# Patient Record
Sex: Female | Born: 1955 | Race: White | Hispanic: No | Marital: Married | Smoking: Never smoker
Health system: Southern US, Community
[De-identification: ages and names within clinical notes are randomized; demographics above are authoritative.]

## PROBLEM LIST (undated history)

## (undated) DIAGNOSIS — G43909 Migraine, unspecified, not intractable, without status migrainosus: Secondary | ICD-10-CM

## (undated) DIAGNOSIS — K219 Gastro-esophageal reflux disease without esophagitis: Secondary | ICD-10-CM

## (undated) DIAGNOSIS — I4891 Unspecified atrial fibrillation: Secondary | ICD-10-CM

## (undated) DIAGNOSIS — E785 Hyperlipidemia, unspecified: Secondary | ICD-10-CM

## (undated) DIAGNOSIS — K589 Irritable bowel syndrome without diarrhea: Secondary | ICD-10-CM

## (undated) DIAGNOSIS — C439 Malignant melanoma of skin, unspecified: Secondary | ICD-10-CM

## (undated) DIAGNOSIS — R002 Palpitations: Secondary | ICD-10-CM

## (undated) DIAGNOSIS — I491 Atrial premature depolarization: Secondary | ICD-10-CM

## (undated) DIAGNOSIS — N8501 Benign endometrial hyperplasia: Principal | ICD-10-CM

## (undated) DIAGNOSIS — R87619 Unspecified abnormal cytological findings in specimens from cervix uteri: Secondary | ICD-10-CM

## (undated) DIAGNOSIS — I10 Essential (primary) hypertension: Secondary | ICD-10-CM

## (undated) DIAGNOSIS — C4490 Unspecified malignant neoplasm of skin, unspecified: Secondary | ICD-10-CM

## (undated) DIAGNOSIS — R159 Full incontinence of feces: Secondary | ICD-10-CM

## (undated) HISTORY — DX: Irritable bowel syndrome, unspecified: K58.9

## (undated) HISTORY — DX: Full incontinence of feces: R15.9

## (undated) HISTORY — DX: Unspecified atrial fibrillation: I48.91

## (undated) HISTORY — DX: Hyperlipidemia, unspecified: E78.5

## (undated) HISTORY — DX: Atrial premature depolarization: I49.1

## (undated) HISTORY — DX: Migraine, unspecified, not intractable, without status migrainosus: G43.909

## (undated) HISTORY — DX: Essential (primary) hypertension: I10

## (undated) HISTORY — DX: Unspecified abnormal cytological findings in specimens from cervix uteri: R87.619

## (undated) HISTORY — DX: Malignant melanoma of skin, unspecified: C43.9

## (undated) HISTORY — DX: Palpitations: R00.2

## (undated) HISTORY — PX: WRIST SURGERY: SHX841

## (undated) HISTORY — DX: Unspecified malignant neoplasm of skin, unspecified: C44.90

## (undated) HISTORY — DX: Benign endometrial hyperplasia: N85.01

## (undated) HISTORY — PX: CERVICAL CERCLAGE: SHX1329

## (undated) HISTORY — DX: Gastro-esophageal reflux disease without esophagitis: K21.9

## (undated) HISTORY — PX: OTHER SURGICAL HISTORY: SHX169

## (undated) HISTORY — PX: SHOULDER ARTHROSCOPY: SHX128

---

## 1978-03-23 HISTORY — PX: CHOLECYSTECTOMY: SHX55

## 1999-12-03 ENCOUNTER — Encounter: Payer: Self-pay | Admitting: Obstetrics and Gynecology

## 1999-12-03 ENCOUNTER — Encounter: Admission: RE | Admit: 1999-12-03 | Discharge: 1999-12-03 | Payer: Self-pay | Admitting: Obstetrics and Gynecology

## 2001-02-08 ENCOUNTER — Encounter: Admission: RE | Admit: 2001-02-08 | Discharge: 2001-02-08 | Payer: Self-pay | Admitting: Obstetrics and Gynecology

## 2001-02-08 ENCOUNTER — Encounter: Payer: Self-pay | Admitting: Obstetrics and Gynecology

## 2002-01-25 ENCOUNTER — Encounter: Admission: RE | Admit: 2002-01-25 | Discharge: 2002-01-25 | Payer: Self-pay | Admitting: Obstetrics and Gynecology

## 2002-01-25 ENCOUNTER — Encounter: Payer: Self-pay | Admitting: Obstetrics and Gynecology

## 2003-03-13 ENCOUNTER — Encounter: Admission: RE | Admit: 2003-03-13 | Discharge: 2003-03-13 | Payer: Self-pay | Admitting: Obstetrics and Gynecology

## 2004-03-23 DIAGNOSIS — C439 Malignant melanoma of skin, unspecified: Secondary | ICD-10-CM

## 2004-03-23 HISTORY — DX: Malignant melanoma of skin, unspecified: C43.9

## 2004-03-23 HISTORY — PX: MELANOMA EXCISION: SHX5266

## 2004-09-08 ENCOUNTER — Encounter: Admission: RE | Admit: 2004-09-08 | Discharge: 2004-09-08 | Payer: Self-pay | Admitting: Obstetrics and Gynecology

## 2004-09-30 DIAGNOSIS — C4490 Unspecified malignant neoplasm of skin, unspecified: Secondary | ICD-10-CM

## 2004-09-30 HISTORY — DX: Unspecified malignant neoplasm of skin, unspecified: C44.90

## 2004-10-15 ENCOUNTER — Encounter: Admission: RE | Admit: 2004-10-15 | Discharge: 2004-10-27 | Payer: Self-pay | Admitting: Plastic Surgery

## 2004-10-21 ENCOUNTER — Ambulatory Visit (HOSPITAL_COMMUNITY): Admission: RE | Admit: 2004-10-21 | Discharge: 2004-10-21 | Payer: Self-pay | Admitting: General Surgery

## 2004-11-10 ENCOUNTER — Encounter (INDEPENDENT_AMBULATORY_CARE_PROVIDER_SITE_OTHER): Payer: Self-pay | Admitting: *Deleted

## 2004-11-10 ENCOUNTER — Ambulatory Visit (HOSPITAL_BASED_OUTPATIENT_CLINIC_OR_DEPARTMENT_OTHER): Admission: RE | Admit: 2004-11-10 | Discharge: 2004-11-10 | Payer: Self-pay | Admitting: General Surgery

## 2004-11-10 ENCOUNTER — Ambulatory Visit (HOSPITAL_COMMUNITY): Admission: RE | Admit: 2004-11-10 | Discharge: 2004-11-10 | Payer: Self-pay | Admitting: General Surgery

## 2004-12-10 ENCOUNTER — Ambulatory Visit: Payer: Self-pay | Admitting: Hematology and Oncology

## 2005-01-01 ENCOUNTER — Ambulatory Visit (HOSPITAL_COMMUNITY): Admission: RE | Admit: 2005-01-01 | Discharge: 2005-01-01 | Payer: Self-pay | Admitting: Hematology and Oncology

## 2005-02-24 ENCOUNTER — Ambulatory Visit: Payer: Self-pay | Admitting: Hematology and Oncology

## 2005-03-06 ENCOUNTER — Encounter: Admission: RE | Admit: 2005-03-06 | Discharge: 2005-03-06 | Payer: Self-pay | Admitting: *Deleted

## 2005-08-11 ENCOUNTER — Ambulatory Visit: Payer: Self-pay | Admitting: Hematology and Oncology

## 2005-08-13 LAB — CBC WITH DIFFERENTIAL/PLATELET
BASO%: 0.3 % (ref 0.0–2.0)
Basophils Absolute: 0 10*3/uL (ref 0.0–0.1)
EOS%: 3.1 % (ref 0.0–7.0)
HGB: 13.1 g/dL (ref 11.6–15.9)
MCH: 28.8 pg (ref 26.0–34.0)
MONO#: 0.5 10*3/uL (ref 0.1–0.9)
RDW: 15 % — ABNORMAL HIGH (ref 11.3–14.5)
WBC: 6.3 10*3/uL (ref 3.9–10.0)
lymph#: 1.8 10*3/uL (ref 0.9–3.3)

## 2005-08-13 LAB — COMPREHENSIVE METABOLIC PANEL
ALT: 15 U/L (ref 0–40)
AST: 18 U/L (ref 0–37)
Albumin: 4.2 g/dL (ref 3.5–5.2)
BUN: 9 mg/dL (ref 6–23)
Calcium: 9.3 mg/dL (ref 8.4–10.5)
Chloride: 97 mEq/L (ref 96–112)
Potassium: 3.6 mEq/L (ref 3.5–5.3)

## 2005-12-02 ENCOUNTER — Encounter: Admission: RE | Admit: 2005-12-02 | Discharge: 2005-12-02 | Payer: Self-pay | Admitting: Obstetrics and Gynecology

## 2006-08-31 ENCOUNTER — Encounter: Admission: RE | Admit: 2006-08-31 | Discharge: 2006-08-31 | Payer: Self-pay | Admitting: Obstetrics and Gynecology

## 2007-02-18 ENCOUNTER — Ambulatory Visit (HOSPITAL_COMMUNITY): Admission: RE | Admit: 2007-02-18 | Discharge: 2007-02-18 | Payer: Self-pay | Admitting: Hematology and Oncology

## 2007-02-21 ENCOUNTER — Ambulatory Visit: Payer: Self-pay | Admitting: Hematology and Oncology

## 2007-02-23 LAB — CBC WITH DIFFERENTIAL/PLATELET
BASO%: 0.8 % (ref 0.0–2.0)
EOS%: 4.6 % (ref 0.0–7.0)
MCH: 31.5 pg (ref 26.0–34.0)
MCHC: 35.5 g/dL (ref 32.0–36.0)
RDW: 10.5 % — ABNORMAL LOW (ref 11.3–14.5)
lymph#: 1.7 10*3/uL (ref 0.9–3.3)

## 2007-02-23 LAB — COMPREHENSIVE METABOLIC PANEL
ALT: 11 U/L (ref 0–35)
AST: 13 U/L (ref 0–37)
Albumin: 4.4 g/dL (ref 3.5–5.2)
Calcium: 9.4 mg/dL (ref 8.4–10.5)
Chloride: 100 mEq/L (ref 96–112)
Potassium: 4.2 mEq/L (ref 3.5–5.3)

## 2007-08-23 ENCOUNTER — Encounter: Admission: RE | Admit: 2007-08-23 | Discharge: 2007-08-23 | Payer: Self-pay | Admitting: Family Medicine

## 2008-02-20 ENCOUNTER — Ambulatory Visit: Payer: Self-pay | Admitting: Hematology and Oncology

## 2008-02-22 LAB — IRON AND TIBC
TIBC: 396 ug/dL (ref 250–470)
UIBC: 333 ug/dL

## 2008-02-22 LAB — COMPREHENSIVE METABOLIC PANEL
AST: 27 U/L (ref 0–37)
Albumin: 4.8 g/dL (ref 3.5–5.2)
Alkaline Phosphatase: 28 U/L — ABNORMAL LOW (ref 39–117)
BUN: 12 mg/dL (ref 6–23)
Potassium: 3.7 mEq/L (ref 3.5–5.3)

## 2008-02-22 LAB — CBC WITH DIFFERENTIAL/PLATELET
BASO%: 0.4 % (ref 0.0–2.0)
Basophils Absolute: 0 10*3/uL (ref 0.0–0.1)
EOS%: 5 % (ref 0.0–7.0)
MCH: 30.8 pg (ref 26.0–34.0)
MCHC: 34.6 g/dL (ref 32.0–36.0)
MCV: 88.9 fL (ref 81.0–101.0)
MONO%: 7.9 % (ref 0.0–13.0)
RBC: 4.46 10*6/uL (ref 3.70–5.32)
RDW: 12.6 % (ref 11.3–14.5)

## 2008-02-22 LAB — FERRITIN: Ferritin: 55 ng/mL (ref 10–291)

## 2008-02-22 LAB — VITAMIN B12: Vitamin B-12: 368 pg/mL (ref 211–911)

## 2008-02-23 ENCOUNTER — Ambulatory Visit (HOSPITAL_COMMUNITY): Admission: RE | Admit: 2008-02-23 | Discharge: 2008-02-23 | Payer: Self-pay | Admitting: Hematology and Oncology

## 2008-03-23 HISTORY — PX: ENDOMETRIAL BIOPSY: SHX622

## 2008-08-14 ENCOUNTER — Encounter: Admission: RE | Admit: 2008-08-14 | Discharge: 2008-08-14 | Payer: Self-pay | Admitting: Family Medicine

## 2009-01-31 ENCOUNTER — Ambulatory Visit (HOSPITAL_BASED_OUTPATIENT_CLINIC_OR_DEPARTMENT_OTHER): Admission: RE | Admit: 2009-01-31 | Discharge: 2009-01-31 | Payer: Self-pay | Admitting: Orthopedic Surgery

## 2009-06-25 ENCOUNTER — Encounter: Admission: RE | Admit: 2009-06-25 | Discharge: 2009-06-25 | Payer: Self-pay | Admitting: Hematology and Oncology

## 2009-06-27 ENCOUNTER — Ambulatory Visit (HOSPITAL_BASED_OUTPATIENT_CLINIC_OR_DEPARTMENT_OTHER): Admission: RE | Admit: 2009-06-27 | Discharge: 2009-06-27 | Payer: Self-pay | Admitting: Orthopedic Surgery

## 2009-07-04 ENCOUNTER — Ambulatory Visit: Payer: Self-pay | Admitting: Hematology and Oncology

## 2009-07-08 ENCOUNTER — Ambulatory Visit (HOSPITAL_COMMUNITY): Admission: RE | Admit: 2009-07-08 | Discharge: 2009-07-08 | Payer: Self-pay | Admitting: Hematology and Oncology

## 2009-07-08 LAB — COMPREHENSIVE METABOLIC PANEL
Albumin: 4.5 g/dL (ref 3.5–5.2)
Alkaline Phosphatase: 21 U/L — ABNORMAL LOW (ref 39–117)
BUN: 9 mg/dL (ref 6–23)
Creatinine, Ser: 0.72 mg/dL (ref 0.40–1.20)
Glucose, Bld: 121 mg/dL — ABNORMAL HIGH (ref 70–99)
Potassium: 3 mEq/L — ABNORMAL LOW (ref 3.5–5.3)
Total Bilirubin: 0.3 mg/dL (ref 0.3–1.2)

## 2009-07-08 LAB — CBC WITH DIFFERENTIAL/PLATELET
Basophils Absolute: 0 10*3/uL (ref 0.0–0.1)
Eosinophils Absolute: 0.2 10*3/uL (ref 0.0–0.5)
HCT: 40.6 % (ref 34.8–46.6)
HGB: 14 g/dL (ref 11.6–15.9)
LYMPH%: 17 % (ref 14.0–49.7)
MCV: 89.4 fL (ref 79.5–101.0)
MONO%: 6.8 % (ref 0.0–14.0)
NEUT#: 6.6 10*3/uL — ABNORMAL HIGH (ref 1.5–6.5)
NEUT%: 74 % (ref 38.4–76.8)
Platelets: 317 10*3/uL (ref 145–400)

## 2010-06-11 LAB — POCT I-STAT, CHEM 8
Creatinine, Ser: 0.6 mg/dL (ref 0.4–1.2)
Hemoglobin: 13.9 g/dL (ref 12.0–15.0)
Potassium: 3.3 mEq/L — ABNORMAL LOW (ref 3.5–5.1)
Sodium: 138 mEq/L (ref 135–145)

## 2010-06-25 LAB — POCT HEMOGLOBIN-HEMACUE: Hemoglobin: 12.7 g/dL (ref 12.0–15.0)

## 2010-06-25 LAB — BASIC METABOLIC PANEL
GFR calc non Af Amer: >60 mL/min (ref >60)
Glucose, Bld: 109 mg/dL — ABNORMAL HIGH (ref 70–99)
Potassium: 3.8 mEq/L (ref 3.5–5.1)
Sodium: 136 mEq/L (ref 135–145)

## 2010-08-08 NOTE — Op Note (Signed)
[name], Kristina Delgado            ACCOUNT NO.:  [id number]   MEDICAL RECORD NO.:  [id number]          PATIENT TYPE:  AMB   LOCATION:  DSC                          FACILITY:  [facility]   PHYSICIAN:  Gita Kudo, M.D. DATE OF BIRTH:  1955/06/27   DATE OF PROCEDURE:  11/10/2004  DATE OF DISCHARGE:                                 OPERATIVE REPORT   OPERATIVE PROCEDURE:  Sentinel node biopsy, right supraclavicular, wide  excision of melanoma, right upper back, excision of dysplastic nevus, right  buttock.   SURGEON:  Gita Kudo, M.D.   ANESTHESIA:  General.   PREOPERATIVE DIAGNOSIS:  Malignant melanoma of back, dysplastic nevus, right  buttock, lymphoscintigram shows uptake primarily in the right  supraclavicular region and then later and both axillae.   OPERATIVE FINDINGS:  I used the NeoProbe to scan the patient preop.  She did  have good activity in the right supraclavicular region.  None noted on the  left supraclavicular or either axilla.  Although I injected blue dye, I did  not see any blue dye in the supraclavicular region.   OPERATIVE PROCEDURE:  Under satisfactory general anesthesia, I infiltrated 4  mL of the Lymphazurin dye intradermally at the previous excision site.  Then  she was positioned, prepped and draped for a right supraclavicular  dissection.  The hot area previously noted was rechecked with the camera and  then cut down upon.  I did not encounter any blue dye, but I did find one  hot node.  This was removed and it had a very high count of about 500-600.  This was then sent.  I probed the remaining area and did find another area  of about 200.  I identified it with a probe, but could never really see it  or feel it.  After dissecting it for some time, I decided that it would  possibly hurts the patient if I kept going and that it might not benefit  them at all and therefore I stopped.  The wound was then infiltrated with 10  mL of 0.5% Marcaine with  epinephrine and closed in layers with Vicryl and  nylon and Steri-Strips.   Following this, the right buttock area was prepped and draped in a standard  fashion, infiltrated with the above Marcaine -- 10 mL -- and excised as an  ellipse with conservative margins.  It was removed down to the fat.  The  wound then closed with 3-0 nylon and 4-0 nylon mattress sutures and steri-  stripped.   Then the melanoma of the back biopsy site was identified where the blue dye  was.  I marked out 1 cm in each direction and went outside that in an  elliptically oriented excision that measured approximately 7 x 3 cm.  The  specimen was removed widely, including the fat down to the fascia, but not  the fascia.  Specimen was marked and  sent to Pathology.  This wound was closed likewise in layers with deep 2-0  Vicryl sutures followed by 3-0 and 4-0 skin nylon.  Sterile absorbent  dressings were applied and  the patient went to the recovery room in good  condition.  Blood loss was negligible.  No complications.           ______________________________  Gita Kudo, M.D.     MRL/MEDQ  D:  11/10/2004  T:  11/11/2004  Job:  [id number]   cc:   Al Decant. Janey Greaser, MD  [address]  [place]  [place] [address]  Fax: [phone]   [facility]. Danella Deis, M.D.  [address]  [place]  [place] [address]  Fax: [phone]

## 2010-08-13 ENCOUNTER — Other Ambulatory Visit: Payer: Self-pay | Admitting: Family Medicine

## 2010-08-13 DIAGNOSIS — Z1231 Encounter for screening mammogram for malignant neoplasm of breast: Secondary | ICD-10-CM

## 2010-09-10 ENCOUNTER — Ambulatory Visit
Admission: RE | Admit: 2010-09-10 | Discharge: 2010-09-10 | Disposition: A | Discharge status: Home / Self Care | Payer: Private Health Insurance - Indemnity | Source: Ambulatory Visit | Attending: Family Medicine | Admitting: Family Medicine

## 2010-09-10 DIAGNOSIS — Z1231 Encounter for screening mammogram for malignant neoplasm of breast: Secondary | ICD-10-CM

## 2010-09-15 ENCOUNTER — Encounter (HOSPITAL_BASED_OUTPATIENT_CLINIC_OR_DEPARTMENT_OTHER)
Admission: RE | Admit: 2010-09-15 | Discharge: 2010-09-15 | Disposition: A | Discharge status: Home / Self Care | Payer: Private Health Insurance - Indemnity | Source: Ambulatory Visit | Attending: Orthopedic Surgery | Admitting: Orthopedic Surgery

## 2010-09-16 ENCOUNTER — Ambulatory Visit (HOSPITAL_BASED_OUTPATIENT_CLINIC_OR_DEPARTMENT_OTHER)
Admission: RE | Admit: 2010-09-16 | Discharge: 2010-09-16 | Disposition: A | Discharge status: Home / Self Care | Payer: Private Health Insurance - Indemnity | Source: Ambulatory Visit | Attending: Orthopedic Surgery | Admitting: Orthopedic Surgery

## 2010-09-16 DIAGNOSIS — M7530 Calcific tendinitis of unspecified shoulder: Secondary | ICD-10-CM | POA: Insufficient documentation

## 2010-09-16 DIAGNOSIS — M24119 Other articular cartilage disorders, unspecified shoulder: Secondary | ICD-10-CM | POA: Insufficient documentation

## 2010-09-16 DIAGNOSIS — Z01812 Encounter for preprocedural laboratory examination: Secondary | ICD-10-CM | POA: Insufficient documentation

## 2010-09-16 DIAGNOSIS — M19019 Primary osteoarthritis, unspecified shoulder: Secondary | ICD-10-CM | POA: Insufficient documentation

## 2010-09-16 DIAGNOSIS — M25819 Other specified joint disorders, unspecified shoulder: Secondary | ICD-10-CM | POA: Insufficient documentation

## 2010-09-16 DIAGNOSIS — Z01810 Encounter for preprocedural cardiovascular examination: Secondary | ICD-10-CM | POA: Insufficient documentation

## 2010-09-18 NOTE — Op Note (Signed)
NAMEHAROLD, MATTES            ACCOUNT NO.:  [id number]  MEDICAL RECORD NO.:  [id number]  LOCATION:                                 FACILITY:  PHYSICIAN:  Katy Fitch. Vernelle Wisner, M.D.      DATE OF BIRTH:  DATE OF PROCEDURE:  09/16/2010 DATE OF DISCHARGE:                              OPERATIVE REPORT   PREOPERATIVE DIAGNOSES: 1. 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 2. Chronic acromioclavicular degenerative arthritis with unstable     acromioclavicular joint and dynamic impingement of rotator cuff. 3. Also, degenerative labral fray without evidence of superior labral     anterior-posterior lesion.  OPERATIONS: 1. Examination of right shoulder under anesthesia, documenting a     significant impingement click at 100 degrees of elevation. 2. Diagnostic arthroscopy revealing a degenerative labrum and an     intact biceps origin and intact rotator cuff followed by     debridement of labrum. 3. Subacromial decompression with bursectomy, relaxation of     coracoacromial ligament and acromioplasty. 4. Arthroscopic distal clavicle resection. 5. Debridement of degenerative rotator cuff bursal surface with serial     puncture of calcific tendinopathy and debridement of superficial     degenerative tendon overlying calcific tendinopathy deposit.  OPERATING SURGEON:  Katy Fitch. Abdulai Blaylock, MD  ASSISTANT:  Marveen Reeks Dasnoit, PA  ANESTHESIA:  General by endotracheal technique supplemented by a right plexus block, supervising anesthesiologist is Janetta Hora. 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  She is status post debridement and decompression of her left shoulder in April 2011 and had a very satisfactory response to debridement,  subacromial decompression, distal clavicle resection, and excision of her calcium deposit with arthroscopic technique.  Tiasia had an ulcerated supraspinatus rotator cuff tendon that was partially debrided and allowed to heal by secondary intention.  Her left side has been pain free following this procedure and appropriate rehabilitation.  She has had similar symptoms on the right and was noted 6 months prior to have calcific tendinopathy of the right shoulder.  She requested that we consider proceeding with a similar procedure on the right side.  She had some symptoms on the right that were suggestive of possible SLAP lesion or labral injury as well.  We advised that we would proceed with diagnostic arthroscopy, examination of the shoulder under anesthesia, anticipating tendon debridements, subacromial decompression, and distal clavicle resection. We reported that we would repair the rotator cuff if we found that there were significant issues that would require tendon repair.  Preoperatively, questions were invited and answered in detail.  Marji was noted be allergic to SULFA and had difficulties with many adhesives. She cannot tolerate Tegaderm.  She can however tolerate paper tape.  Questions were invited and answered preoperatively.  She was interviewed by Dr. Gelene Mink of Anesthesia and had an ultrasound-guided plexus block placed without complication.  DESCRIPTION OF PROCEDURE:  Sissi Padia was brought to room #6 of the [facility] and placed in supine position upon the  operating table.  Following Dr. Thornton Dales detailed informed consent in the holding area, general anesthesia by endotracheal technique was induced under his direct supervision.  She was carefully positioned in the beach-chair position with aid of a torso and head holder designed for shoulder arthroscopy.  The entire right upper extremity and forequarter were prepped with DuraPrep and  draped with impervious arthroscopy drapes.  A 1 g of Ancef was administered as an IV prophylactic antibiotic.  Following routine surgical time-out, procedure commenced with examination of the shoulder under anesthesia.  There was a loud pop and click with elevation of the arm above 100 degrees of elevation and forward flexion, consistent with either a bursal click or a partial- thickness rotator cuff click beneath the coracoacromial ligament and anterior acromion.  The scope was introduced through a standard posterior viewing portal with blunt technique.  Diagnostic arthroscopy revealed a degenerative labrum that was frayed at 2 o'clock, 1 o'clock, and 12 o'clock.  This was debrided with a suction shaver brought in anteriorly.  The biceps origin was tested and found to be completely normal.  There is no evidence of SLAP lesion.  The rotator cuff was normal with inspection of the subscapularis, supraspinatus, infraspinatus rotator cuff tendons. The hyaline articular cartilage surface of the glenoid and humeral head were normal and the labral integrity was normal without evidence of a Bankart type lesion.  The scope was removed from the glenohumeral joint and placed in a subacromial space.  There was florid bursitis noted.  After bursal clearing, the cautery was used to relax coracoacromial ligament.  The anterior acromion was prominent and the [facility] joint was prominent medially with an osteophyte at the medial acromion.  A suction shaver was used to level the acromion to type 1 morphology followed by hemostasis with a bipolar cautery.  The distal centimeter clavicle was removed arthroscopically with suction burr.  The rotator cuff tendon was carefully inspected and found to be frayed, very degenerative, and essentially necrotic on the superficial surface.  Free calcium was identified followed by use of a nerve hook to repeatedly puncture the tendon to break up the calcium deposit.  The  superficial layer of tendon cells which were quite degenerative was thoroughly debrided with a 4.2- mm suction shaver.  Hemostasis achieved with bipolar cautery. Photographic documentation of the debridement, decompression, calcium deposit, and distal clavicle resection was accomplished with a digital camera.  The scope was removed and the portals repaired with intradermal 3-0 Prolene suture.  Dressing applied with sterile gauze and paper tape. Ms. Pitstick was placed in a sling for postoperative comfort.  She was transferred to recovery with stable vital signs.  She will be discharged home with prescriptions for: 1. Percocet 5 mg 1 p.o. q.4-6 hours p.r.n. pain, 30 tablets without     refill. 2. Motrin 600 mg 1 p.o. q.6 hours p.r.n. pain with food. 3. Keflex 500 mg 1 p.o. q.8 hours x4 days as prophylactic antibiotic.     Katy Fitch Dontavia Brand, M.D.     RVS/MEDQ  D:  09/16/2010  T:  09/17/2010  Job:  [id number]  Electronically Signed by Josephine Igo M.D. on 09/18/2010 02:08:49 PM

## 2010-10-16 ENCOUNTER — Other Ambulatory Visit: Payer: Self-pay | Admitting: Hematology and Oncology

## 2010-10-16 ENCOUNTER — Ambulatory Visit (HOSPITAL_COMMUNITY)
Admission: RE | Admit: 2010-10-16 | Discharge: 2010-10-16 | Disposition: A | Discharge status: Home / Self Care | Payer: 59 | Source: Ambulatory Visit | Attending: Hematology and Oncology | Admitting: Hematology and Oncology

## 2010-10-16 ENCOUNTER — Encounter (HOSPITAL_BASED_OUTPATIENT_CLINIC_OR_DEPARTMENT_OTHER): Payer: 59 | Admitting: Hematology and Oncology

## 2010-10-16 DIAGNOSIS — Z09 Encounter for follow-up examination after completed treatment for conditions other than malignant neoplasm: Secondary | ICD-10-CM | POA: Insufficient documentation

## 2010-10-16 DIAGNOSIS — C439 Malignant melanoma of skin, unspecified: Secondary | ICD-10-CM

## 2010-10-16 DIAGNOSIS — R110 Nausea: Secondary | ICD-10-CM

## 2010-10-16 DIAGNOSIS — R5381 Other malaise: Secondary | ICD-10-CM

## 2010-10-16 DIAGNOSIS — Z85820 Personal history of malignant melanoma of skin: Secondary | ICD-10-CM | POA: Insufficient documentation

## 2010-10-16 DIAGNOSIS — Z9089 Acquired absence of other organs: Secondary | ICD-10-CM | POA: Insufficient documentation

## 2010-10-16 DIAGNOSIS — Z23 Encounter for immunization: Secondary | ICD-10-CM

## 2010-10-16 DIAGNOSIS — C4910 Malignant neoplasm of connective and soft tissue of unspecified upper limb, including shoulder: Secondary | ICD-10-CM

## 2010-10-16 LAB — COMPREHENSIVE METABOLIC PANEL
ALT: 12 U/L (ref 0–35)
AST: 14 U/L (ref 0–37)
Albumin: 4.4 g/dL (ref 3.5–5.2)
Alkaline Phosphatase: 20 U/L — ABNORMAL LOW (ref 39–117)
Potassium: 3.6 mEq/L (ref 3.5–5.3)
Sodium: 138 mEq/L (ref 135–145)
Total Protein: 6.7 g/dL (ref 6.0–8.3)

## 2010-10-16 LAB — CBC WITH DIFFERENTIAL/PLATELET
Eosinophils Absolute: 0.1 10*3/uL (ref 0.0–0.5)
MONO#: 0.4 10*3/uL (ref 0.1–0.9)
NEUT#: 3.3 10*3/uL (ref 1.5–6.5)
Platelets: 235 10*3/uL (ref 145–400)
RBC: 4.5 10*6/uL (ref 3.70–5.45)
RDW: 12.4 % (ref 11.2–14.5)
WBC: 5.1 10*3/uL (ref 3.9–10.3)
lymph#: 1.2 10*3/uL (ref 0.9–3.3)

## 2011-09-09 ENCOUNTER — Other Ambulatory Visit: Payer: Self-pay | Admitting: Hematology and Oncology

## 2011-09-09 ENCOUNTER — Telehealth: Payer: Self-pay | Admitting: Hematology and Oncology

## 2011-09-09 DIAGNOSIS — C439 Malignant melanoma of skin, unspecified: Secondary | ICD-10-CM

## 2011-09-09 NOTE — Telephone Encounter (Signed)
Pt called to schedule an appt with dr odogwu. S/w the desk nurse and dr Dalene Carrow entered an appt to see this pt in aug which was the pt's request. Pt will call me back within two days to confirm

## 2011-09-17 ENCOUNTER — Other Ambulatory Visit: Payer: Self-pay | Admitting: Hematology and Oncology

## 2011-09-17 DIAGNOSIS — Z1231 Encounter for screening mammogram for malignant neoplasm of breast: Secondary | ICD-10-CM

## 2011-10-20 ENCOUNTER — Ambulatory Visit: Payer: 59

## 2011-10-28 ENCOUNTER — Other Ambulatory Visit: Payer: 59

## 2011-10-28 ENCOUNTER — Ambulatory Visit
Admission: RE | Admit: 2011-10-28 | Discharge: 2011-10-28 | Disposition: A | Discharge status: Home / Self Care | Payer: 59 | Source: Ambulatory Visit | Attending: Hematology and Oncology | Admitting: Hematology and Oncology

## 2011-10-28 DIAGNOSIS — Z1231 Encounter for screening mammogram for malignant neoplasm of breast: Secondary | ICD-10-CM

## 2011-10-29 ENCOUNTER — Other Ambulatory Visit (HOSPITAL_BASED_OUTPATIENT_CLINIC_OR_DEPARTMENT_OTHER): Payer: 59 | Admitting: Lab

## 2011-10-29 ENCOUNTER — Ambulatory Visit (HOSPITAL_COMMUNITY)
Admission: RE | Admit: 2011-10-29 | Discharge: 2011-10-29 | Disposition: A | Discharge status: Home / Self Care | Payer: 59 | Source: Ambulatory Visit | Attending: Hematology and Oncology | Admitting: Hematology and Oncology

## 2011-10-29 ENCOUNTER — Encounter: Payer: Self-pay | Admitting: *Deleted

## 2011-10-29 DIAGNOSIS — C439 Malignant melanoma of skin, unspecified: Secondary | ICD-10-CM | POA: Insufficient documentation

## 2011-10-29 LAB — CBC WITH DIFFERENTIAL/PLATELET
BASO%: 0.8 % (ref 0.0–2.0)
HCT: 39.9 % (ref 34.8–46.6)
MCHC: 33.7 g/dL (ref 31.5–36.0)
MONO#: 0.5 10*3/uL (ref 0.1–0.9)
NEUT#: 3 10*3/uL (ref 1.5–6.5)
NEUT%: 57.5 % (ref 38.4–76.8)
RBC: 4.43 10*6/uL (ref 3.70–5.45)
WBC: 5.2 10*3/uL (ref 3.9–10.3)
lymph#: 1.4 10*3/uL (ref 0.9–3.3)

## 2011-10-29 LAB — COMPREHENSIVE METABOLIC PANEL
ALT: 17 U/L (ref 0–35)
CO2: 28 mEq/L (ref 19–32)
Calcium: 9.4 mg/dL (ref 8.4–10.5)
Chloride: 102 mEq/L (ref 96–112)
Sodium: 139 mEq/L (ref 135–145)
Total Protein: 6.6 g/dL (ref 6.0–8.3)

## 2011-10-29 LAB — LACTATE DEHYDROGENASE: LDH: 115 U/L (ref 94–250)

## 2011-11-03 ENCOUNTER — Ambulatory Visit (HOSPITAL_BASED_OUTPATIENT_CLINIC_OR_DEPARTMENT_OTHER): Payer: 59 | Admitting: Hematology and Oncology

## 2011-11-03 ENCOUNTER — Telehealth: Payer: Self-pay | Admitting: Hematology and Oncology

## 2011-11-03 ENCOUNTER — Encounter: Payer: Self-pay | Admitting: Hematology and Oncology

## 2011-11-03 ENCOUNTER — Ambulatory Visit (HOSPITAL_BASED_OUTPATIENT_CLINIC_OR_DEPARTMENT_OTHER): Payer: 59

## 2011-11-03 VITALS — BP 138/79 | HR 78 | Temp 97.2°F | Resp 20 | Ht 64.0 in | Wt 139.8 lb

## 2011-11-03 DIAGNOSIS — R5381 Other malaise: Secondary | ICD-10-CM

## 2011-11-03 DIAGNOSIS — C439 Malignant melanoma of skin, unspecified: Secondary | ICD-10-CM

## 2011-11-03 DIAGNOSIS — Z85820 Personal history of malignant melanoma of skin: Secondary | ICD-10-CM | POA: Insufficient documentation

## 2011-11-03 DIAGNOSIS — R1013 Epigastric pain: Secondary | ICD-10-CM

## 2011-11-03 LAB — URINALYSIS, MICROSCOPIC - CHCC
Bilirubin (Urine): NEGATIVE
Glucose: NEGATIVE g/dL
Specific Gravity, Urine: 1.01 (ref 1.003–1.035)

## 2011-11-03 NOTE — Progress Notes (Signed)
CC:   Kristina Rud, MD Kristina Delgado. Kristina Delgado., M.D. Kristina Delgado, CNM  IDENTIFYING STATEMENT:  The patient is a 56 year old woman with history of melanoma who presents for followup.  INTERVAL HISTORY:  Kristina Delgado is back for her annual followup visit from [place].  She reports that her dermatologist excised a benign lesion from her right upper chest.  I do not have that pathology report.  She also complains of allergy-type symptoms.  Of more concern, she is complaining of right upper epigastric discomfort and fullness. Dr. Randa Evens plans an endoscopy later on this week.  She has not lost any weight; however, her appetite is diminished.  She denies change in bowel function.  She also notes urinary-type symptoms with increased frequency but denies dysuria.  She is afebrile.  MEDICATIONS:  Reviewed and updated.  ALLERGIES:  Sulfa.  REVIEW OF SYSTEMS:  As above and rest review of systems negative.  PHYSICAL EXAM:  Patient is alert and oriented x3.  Vitals:  Pulse 78, blood pressure 130/79, temperature 97.2, respirations 20, weight 139.8 pounds.  HEENT:  Head is atraumatic, normocephalic.  Sclerae anicteric. Mouth moist.  Chest:  Clear to percussion and auscultation.  CVS: Unremarkable.  Abdomen:  Soft.  Slightly tender in the epigastrium but without rebound or guarding.  Bowel sounds present.  Extremities:  No calf tenderness, edema.  LAB DATA:  10/29/2011, white cell count 5.2, hemoglobin 13.5, hematocrit 39.9, platelets 253, sodium 139, potassium 4.2, chloride 102, CO2 28, BUN 13, creatinine 0.72, glucose 66.  T bilirubin 0.4, alkaline phosphatase 19, AST 18, ALT 17, calcium 9.4.  LDH 115.  A chest x-ray on 10/29/2011 showed no acute cardiopulmonary process or evidence of melanoma.  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  She is status post excision with sentinel node  dissection.  She has had a dysplastic nevi excised from her skin throughout the years.  She complains of epigastric discomfort and I think it would be reasonable for her to obtain a CT scan of the abdomen.  We will telephone her with those results.  In addition, with regards to her urinary tract symptoms, we will have her do a urinalysis and possible culture.  If all is well, she plans to schedule a followup visit with me in a year's time.    ______________________________ Laurice Record, M.D. LIO/MEDQ  D:  11/03/2011  T:  11/03/2011  Job:  [id number]

## 2011-11-03 NOTE — Telephone Encounter (Signed)
appts made and printed for pt aom °

## 2011-11-03 NOTE — Patient Instructions (Addendum)
Kristina Delgado  [id number]  [facility] Discharge Instructions  RECOMMENDATIONS MADE BY THE CONSULTANT AND ANY TEST RESULTS WILL BE SENT TO YOUR REFERRING DOCTOR.   EXAM FINDINGS BY MD TODAY AND SIGNS AND SYMPTOMS TO REPORT TO CLINIC OR PRIMARY MD:   Your current list of medications are: Current Outpatient Prescriptions  Medication Sig Dispense Refill  . ALPRAZolam (XANAX) 0.25 MG tablet Take 0.25 mg by mouth as needed.      . Biotin 1000 MCG tablet Take 1,000 mcg by mouth 2 (two) times daily.      . drospirenone-estradiol (ANGELIQ) 0.5-1 MG per tablet Take 1 tablet by mouth daily.      . fluticasone (FLONASE) 50 MCG/ACT nasal spray Place 1 spray into the nose as needed.       . hyoscyamine (LEVSIN, ANASPAZ) 0.125 MG tablet Take 0.125 mg by mouth as needed.      Marland Kitchen omeprazole (PRILOSEC) 40 MG capsule Take 40 mg by mouth daily.      . simvastatin (ZOCOR) 40 MG tablet Take 40 mg by mouth every evening.      . Vitamin D, Ergocalciferol, (DRISDOL) 50000 UNITS CAPS Take 50,000 Units by mouth every 14 (fourteen) days.      Marland Kitchen zolpidem (AMBIEN) 5 MG tablet Take 5 mg by mouth at bedtime as needed.      Marland Kitchen EPINEPHrine (EPI-PEN) 0.3 mg/0.3 mL DEVI Inject 0.3 mg into the muscle as needed.         INSTRUCTIONS GIVEN AND DISCUSSED:   SPECIAL INSTRUCTIONS/FOLLOW-UP:  See above.  I acknowledge that I have been informed and understand all the instructions given to me and received a copy. I do not have any more questions at this time, but understand that I may call the [facility] at [phone] during business hours should I have any further questions or need assistance in obtaining follow-up care.

## 2011-11-03 NOTE — Progress Notes (Signed)
This office note has been dictated.

## 2011-11-04 ENCOUNTER — Telehealth: Payer: Self-pay | Admitting: *Deleted

## 2011-11-04 NOTE — Telephone Encounter (Signed)
Spoke with pt and informed pt re:  Our U/A results were negative as per md's instructions.   Pt voiced understanding.

## 2011-11-05 LAB — URINE CULTURE

## 2011-11-09 ENCOUNTER — Ambulatory Visit (HOSPITAL_COMMUNITY)
Admission: RE | Admit: 2011-11-09 | Discharge: 2011-11-09 | Disposition: A | Discharge status: Home / Self Care | Payer: 59 | Source: Ambulatory Visit | Attending: Hematology and Oncology | Admitting: Hematology and Oncology

## 2011-11-09 DIAGNOSIS — R109 Unspecified abdominal pain: Secondary | ICD-10-CM | POA: Insufficient documentation

## 2011-11-09 DIAGNOSIS — C439 Malignant melanoma of skin, unspecified: Secondary | ICD-10-CM

## 2011-11-09 DIAGNOSIS — K7689 Other specified diseases of liver: Secondary | ICD-10-CM | POA: Insufficient documentation

## 2011-11-09 MED ORDER — IOHEXOL 300 MG/ML  SOLN
100.0000 mL | Freq: Once | INTRAMUSCULAR | Status: AC | PRN
  Administered 2011-11-09: 100 mL via INTRAVENOUS

## 2011-11-10 ENCOUNTER — Telehealth: Payer: Self-pay | Admitting: Nurse Practitioner

## 2011-11-10 ENCOUNTER — Other Ambulatory Visit: Payer: Self-pay | Admitting: Nurse Practitioner

## 2011-11-10 DIAGNOSIS — Z9109 Other allergy status, other than to drugs and biological substances: Secondary | ICD-10-CM

## 2011-11-10 NOTE — Telephone Encounter (Signed)
Message copied by Barbara Cower on Tue Nov 10, 2011  9:11 AM ------      Message from: Arlan Organ I      Created: Tue Nov 10, 2011  8:59 AM       Call pt CT good. Has a benign lesion hemangioma "collection of blood vessels".            LO

## 2011-11-10 NOTE — Telephone Encounter (Signed)
Spoke with patient- informed that we are unable to make referral per new policy of referrals only to be made related to reason for being seen in this office.  Gave patient contact information for [facility] and recommended she call for an appointment related to allergy symptoms.

## 2011-11-10 NOTE — Telephone Encounter (Signed)
Called patient and informed per Dr. Dalene Carrow- CT good- benign hemangioma noted.  Pt verbalized understanding.  Pt inquired re: referral to an allergist?  States she is in town for one more week and would like to see an allergist while she is here.  RN stated would give information to Dr. Dalene Carrow and return call with information.

## 2011-11-17 ENCOUNTER — Telehealth: Payer: Self-pay | Admitting: Hematology and Oncology

## 2011-11-17 NOTE — Telephone Encounter (Signed)
Per pt she has already seen an allergist. No other appts.

## 2011-11-24 ENCOUNTER — Telehealth: Payer: Self-pay | Admitting: *Deleted

## 2011-11-24 NOTE — Telephone Encounter (Signed)
Pt called requesting a copy of CT scan results to be mailed to pt.   Informed pt that md will be notified on 11/25/11.  Results will be mailed to pt after md aware. Pt also requested results to be mailed to other physicians.  Call transferred to Tiffany in medical records for verbal release of info.

## 2012-09-20 ENCOUNTER — Encounter: Payer: Self-pay | Admitting: Certified Nurse Midwife

## 2012-09-22 ENCOUNTER — Ambulatory Visit: Payer: Self-pay | Admitting: Certified Nurse Midwife

## 2012-09-26 ENCOUNTER — Ambulatory Visit (INDEPENDENT_AMBULATORY_CARE_PROVIDER_SITE_OTHER): Payer: 59 | Admitting: Certified Nurse Midwife

## 2012-09-26 ENCOUNTER — Encounter: Payer: Self-pay | Admitting: Certified Nurse Midwife

## 2012-09-26 VITALS — BP 98/60 | HR 60 | Resp 16 | Ht 64.5 in | Wt 138.0 lb

## 2012-09-26 DIAGNOSIS — N951 Menopausal and female climacteric states: Secondary | ICD-10-CM

## 2012-09-26 DIAGNOSIS — Z01419 Encounter for gynecological examination (general) (routine) without abnormal findings: Secondary | ICD-10-CM

## 2012-09-26 DIAGNOSIS — C439 Malignant melanoma of skin, unspecified: Secondary | ICD-10-CM

## 2012-09-26 DIAGNOSIS — Z0000 Encounter for general adult medical examination without abnormal findings: Secondary | ICD-10-CM

## 2012-09-26 DIAGNOSIS — N898 Other specified noninflammatory disorders of vagina: Secondary | ICD-10-CM

## 2012-09-26 LAB — POCT URINALYSIS DIPSTICK
Glucose, UA: NEGATIVE
Nitrite, UA: NEGATIVE
Protein, UA: NEGATIVE
Urobilinogen, UA: NEGATIVE

## 2012-09-26 MED ORDER — DROSPIRENONE-ESTRADIOL 0.5-1 MG PO TABS: 1.0000 | ORAL_TABLET | Freq: Every day | ORAL | Status: DC

## 2012-09-26 NOTE — Progress Notes (Signed)
57 y.o. [id number] Married Caucasian Fe here for annual exam.  Menopausal on HRT. Feels so much better on HRT. Denies vaginal bleeding. Has noticed some vaginal dryness. Using OTC products for relief. Had bronchoscopy last year with some gastric erosion, on medication. 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 Son graduated with [employer]! Still working with skin changes and travel to [place]. Sees PCP yearly for aex, labs and medications. No health problems today.  Patient's last menstrual period was 05/22/2010.          Sexually active: yes  The current method of family planning is vasectomy.    Exercising: yes  walking & gym Smoker:  no  Health Maintenance: Pap:  10/23/11  Neg HPV HR neg MMG:  8/13 neg Colonoscopy:  2011 BMD:   2006 TDaP:  10/21/11 Labs: Poct urine-rbc trace, hgb-13.0 Self breast exam: occ   reports that she has never smoked. She does not have any smokeless tobacco history on file.  Past Medical History  Diagnosis Date  . Skin cancer 09/30/2004    malignant melanoma.  . Hypertension     previous hx  . Migraine     Past Surgical History  Procedure Laterality Date  . Cholecystectomy  1980  . Wrist surgery      rt wrist  . Shoulder arthroscopy      left 2011/right 2012  . Other surgical history      full buttock revision  . Melanoma excision  2006  . Endometrial biopsy  2010    negative atypia/hyperplasia    Current Outpatient Prescriptions  Medication Sig Dispense Refill  . ALPRAZolam (XANAX) 0.25 MG tablet Take 0.25 mg by mouth as needed.      . Biotin 1000 MCG tablet Take 1,000 mcg by mouth 2 (two) times daily.      . drospirenone-estradiol (ANGELIQ) 0.5-1 MG per tablet Take 1 tablet by mouth daily.      Marland Kitchen EPINEPHrine (EPI-PEN) 0.3 mg/0.3 mL DEVI Inject 0.3 mg into the muscle as needed.      . fluticasone (FLONASE) 50 MCG/ACT nasal spray Place 1 spray into the nose as needed.       . hyoscyamine (LEVSIN, ANASPAZ) 0.125 MG tablet Take 0.125 mg by  mouth as needed.      Marland Kitchen omeprazole (PRILOSEC) 40 MG capsule Take 40 mg by mouth daily.      . simvastatin (ZOCOR) 40 MG tablet Take 40 mg by mouth every evening.      . Vitamin D, Ergocalciferol, (DRISDOL) 50000 UNITS CAPS Take 50,000 Units by mouth every 14 (fourteen) days.      Marland Kitchen zolpidem (AMBIEN) 5 MG tablet Take 5 mg by mouth at bedtime as needed.       No current facility-administered medications for this visit.    Family History  Problem Relation Age of Onset  . Breast cancer Mother   . Cancer Mother     bladder cancer,  precancerous moles.  . Hypertension Mother   . Heart disease Mother   . Cancer Sister     has atypical moles.  . Diabetes Sister   . Cancer Father     bladder  . Hypertension Father     ROS:  Pertinent items are noted in HPI.  Otherwise, a comprehensive ROS was negative.  Exam:   Ht 5' 4.5" (1.638 m)  Wt 138 lb (62.596 kg)  BMI 23.33 kg/m2  LMP 05/22/2010 Height: 5' 4.5" (163.8 cm)  Ht Readings  from Last 3 Encounters:  09/26/12 5' 4.5" (1.638 m)  11/03/11 5\' 4"  (1.626 m)    General appearance: alert, cooperative and appears stated age Head: Normocephalic, without obvious abnormality, atraumatic Neck: no adenopathy, supple, symmetrical, trachea midline and thyroid normal to inspection and palpation Lungs: clear to auscultation bilaterally Breasts: normal appearance, no masses or tenderness, No nipple retraction or dimpling, No nipple discharge or bleeding, No axillary or supraclavicular adenopathy Heart: regular rate and rhythm Abdomen: soft, non-tender; no masses,  no organomegaly Extremities: extremities normal, atraumatic, no cyanosis or edema Skin: Skin color, texture, turgor normal. No rashes or lesions Lymph nodes: Cervical, supraclavicular, and axillary nodes normal. No abnormal inguinal nodes palpated Neurologic: Grossly normal   Pelvic: External genitalia:  no lesions              Urethra:  normal appearing urethra with no masses,  tenderness or lesions              Bartholin's and Skene's: normal                 Vagina: normal appearing vagina with normal color and white discharge, no lesions  Wet prep taken   Ph 4.5              Cervix: normal, non tender              Pap taken: no Bimanual Exam:  Uterus:  normal size, contour, position, consistency, mobility, non-tender and mid position              Adnexa: normal adnexa and no mass, fullness, tenderness               Rectovaginal: Confirms               Anus:  normal sphincter tone, no lesions  Wet Prep: Negative for yeast, BV, Trich  A:  Well Woman with normal exam  Menopausal on HRT desires continuance  Family history of breast cancer mother 39's,     P:   Reviewed health and wellness pertinent to exam  Aware of need to evaluate if vaginal bleeding occurs  Stressed aex and mammogram  Discussed risks and benefits of HRT, patient and provider discussed and feel still a good choice for patient  Rx: Angeliq see order  Lab: TSH, Vitamin D  Pap smear as per guidelines   Mammogram yearly pap smear not taken today  counseled on breast self exam, mammography screening, feminine hygiene, menopause, adequate intake of calcium and vitamin D, diet and exercise  return annually or prn  An After Visit Summary was printed and given to the patient.  Reviewed, TL

## 2012-09-26 NOTE — Patient Instructions (Signed)

## 2012-09-27 ENCOUNTER — Ambulatory Visit: Payer: Self-pay | Admitting: Certified Nurse Midwife

## 2012-09-27 LAB — VITAMIN D 25 HYDROXY (VIT D DEFICIENCY, FRACTURES): Vit D, 25-Hydroxy: 39 ng/mL (ref 30–89)

## 2012-10-31 ENCOUNTER — Other Ambulatory Visit: Payer: Self-pay

## 2012-10-31 DIAGNOSIS — Z1231 Encounter for screening mammogram for malignant neoplasm of breast: Secondary | ICD-10-CM

## 2012-11-07 ENCOUNTER — Other Ambulatory Visit: Payer: Self-pay | Admitting: Medical Oncology

## 2012-11-07 DIAGNOSIS — C439 Malignant melanoma of skin, unspecified: Secondary | ICD-10-CM

## 2012-11-09 ENCOUNTER — Telehealth: Payer: Self-pay | Admitting: Hematology and Oncology

## 2012-11-09 ENCOUNTER — Ambulatory Visit
Admission: RE | Admit: 2012-11-09 | Discharge: 2012-11-09 | Disposition: A | Discharge status: Home / Self Care | Payer: 59 | Source: Ambulatory Visit

## 2012-11-09 DIAGNOSIS — Z1231 Encounter for screening mammogram for malignant neoplasm of breast: Secondary | ICD-10-CM

## 2012-11-09 NOTE — Telephone Encounter (Signed)
LMONVM ADVISING THE PT OF HER APPT ON 12/08/2012 WITH LABS.

## 2012-11-11 ENCOUNTER — Ambulatory Visit: Payer: 59

## 2012-11-22 ENCOUNTER — Telehealth: Payer: Self-pay | Admitting: Hematology and Oncology

## 2012-11-22 NOTE — Telephone Encounter (Signed)
Returned pt's call - pt to see Dr. Bertis Ruddy and r/s 9/18 appt to 10/6. Pt has new appt d/t.

## 2012-12-08 ENCOUNTER — Other Ambulatory Visit: Payer: 59 | Admitting: Lab

## 2012-12-08 ENCOUNTER — Ambulatory Visit: Payer: 59

## 2012-12-22 ENCOUNTER — Telehealth: Payer: Self-pay | Admitting: Hematology and Oncology

## 2012-12-22 NOTE — Telephone Encounter (Signed)
Moved 10/6 appt to 10/17 w/NG. lmonvm for pt re change w/new d/t for lb/NG 10/17 @ 11:30am. Schedule mailed.

## 2012-12-22 NOTE — Telephone Encounter (Signed)
Pt called back re message I left r/s 10/6 appt to 10/17. Due to pt leaving the country 10/8 appt for lb/NG has been moved to 10/7 @ 2:15pm. Pt aware of new d/t.

## 2012-12-26 ENCOUNTER — Other Ambulatory Visit: Payer: 59 | Admitting: Lab

## 2012-12-26 ENCOUNTER — Ambulatory Visit: Payer: 59

## 2012-12-27 ENCOUNTER — Other Ambulatory Visit: Payer: 59 | Admitting: Lab

## 2012-12-27 ENCOUNTER — Encounter: Payer: Self-pay | Admitting: Hematology and Oncology

## 2012-12-27 ENCOUNTER — Other Ambulatory Visit: Payer: Self-pay | Admitting: Hematology and Oncology

## 2012-12-27 ENCOUNTER — Ambulatory Visit (HOSPITAL_BASED_OUTPATIENT_CLINIC_OR_DEPARTMENT_OTHER): Payer: 59 | Admitting: Hematology and Oncology

## 2012-12-27 VITALS — BP 163/99 | HR 88 | Temp 97.8°F | Resp 20 | Ht 64.5 in | Wt 138.6 lb

## 2012-12-27 DIAGNOSIS — C439 Malignant melanoma of skin, unspecified: Secondary | ICD-10-CM

## 2012-12-27 DIAGNOSIS — Z85820 Personal history of malignant melanoma of skin: Secondary | ICD-10-CM

## 2012-12-27 DIAGNOSIS — E559 Vitamin D deficiency, unspecified: Secondary | ICD-10-CM

## 2012-12-27 NOTE — Progress Notes (Signed)
[facility] OFFICE PROGRESS NOTE  BABAOFF, MARC E, MD  DIAGNOSIS: T1 A. N0 M0 melanoma status post resection, for further management  SUMMARY OF ONCOLOGIC HISTORY: This is a pleasant lady with excessive sun exposure in the past, had numerous skin lesions removed over the years. In 2006, she was noted to have an abnormal lesion on her upper back which came back melanoma. She underwent a wide local excision followed by sentinel lymph node biopsy which came back negative. Overall staging is T1 A. N0 M0 melanoma.  INTERVAL HISTORY: Kristina Delgado 57 y.o. female returns for further followup visit related to her history of melanoma. She denies any new skin lesions. The patient travels a lot but continue to see [place] on a yearly basis and her dermatologist regularly. She resides in [place] for most part of the year and will be flying soon. She has been using skin protection and avoiding excessive sun exposure. She take vitamin D supplement regularly. All her other screening program for up to date  I have reviewed the past medical history, past surgical history, social history and family history with the patient and they are unchanged from previous note.  ALLERGIES:  is allergic to latex and sulfa antibiotics.  MEDICATIONS: Current outpatient prescriptions:BEPREVE 1.5 % SOLN, 2 (two) times daily., Disp: , Rfl: ;  dexlansoprazole (DEXILANT) 60 MG capsule, Take 60 mg by mouth daily., Disp: , Rfl: ;  drospirenone-estradiol (ANGELIQ) 0.5-1 MG per tablet, Take 1 tablet by mouth daily., Disp: 90 tablet, Rfl: 4;  DYMISTA 137-50 MCG/ACT SUSP, 2 (two) times daily., Disp: , Rfl: ;  EPINEPHrine (EPIPEN 2-PAK IJ), Inject as directed., Disp: , Rfl:  hyoscyamine (LEVSIN, ANASPAZ) 0.125 MG tablet, Take 0.125 mg by mouth as needed., Disp: , Rfl: ;  ketoconazole (NIZORAL) 2 % cream, , Disp: , Rfl: ;  Ketoconazole-Hydrocortisone (KETOCON EX), Apply topically as needed. cream, Disp: , Rfl: ;  montelukast  (SINGULAIR) 10 MG tablet, daily., Disp: , Rfl: ;  simvastatin (ZOCOR) 40 MG tablet, Take 40 mg by mouth every evening., Disp: , Rfl:  zolpidem (AMBIEN) 5 MG tablet, Take 5 mg by mouth at bedtime as needed for sleep., Disp: , Rfl: ;  ALPRAZolam (XANAX) 0.25 MG tablet, Take 0.25 mg by mouth as needed., Disp: , Rfl:   REVIEW OF SYSTEMS:   Constitutional: Denies fevers, chills or abnormal weight loss Eyes: Denies blurriness of vision Ears, nose, mouth, throat, and face: Denies mucositis or sore throat Respiratory: Denies cough, dyspnea or wheezes Cardiovascular: Denies palpitation, chest discomfort or lower extremity swelling Gastrointestinal:  Denies nausea, heartburn or change in bowel habits Lymphatics: Denies new lymphadenopathy or easy bruising Neurological:Denies numbness, tingling or new weaknesses Behavioral/Psych: Mood is stable, no new changes  All other systems were reviewed with the patient and are negative.  PHYSICAL EXAMINATION: ECOG PERFORMANCE STATUS: 0 - Asymptomatic  Filed Vitals:   12/27/12 1458  BP: 163/99  Pulse: 88  Temp: 97.8 F (36.6 C)  Resp: 20   Filed Weights   12/27/12 1458  Weight: 138 lb 9.6 oz (62.869 kg)    GENERAL:alert, no distress and comfortable SKIN: skin color, texture, turgor are normal, no rashes she has numerous moles that look benign EYES: normal, Conjunctiva are pink and non-injected, sclera clear OROPHARYNX:no exudate, no erythema and lips, buccal mucosa, and tongue normal  NECK: supple, thyroid normal size, non-tender, without nodularity LYMPH:  no palpable lymphadenopathy in the cervical, axillary or inguinal LUNGS: clear to auscultation and percussion with  normal breathing effort HEART: regular rate & rhythm and no murmurs and no lower extremity edema ABDOMEN:abdomen soft, non-tender and normal bowel sounds Musculoskeletal:no cyanosis of digits and no clubbing  NEURO: alert & oriented x 3 with fluent speech, no focal motor/sensory  deficits  LABORATORY DATA:  I have reviewed the data as listed    Component Value Date/Time   NA 139 10/29/2011 0939   K 4.2 10/29/2011 0939   CL 102 10/29/2011 0939   CO2 28 10/29/2011 0939   GLUCOSE 66* 10/29/2011 0939   BUN 13 10/29/2011 0939   CREATININE 0.72 10/29/2011 0939   CALCIUM 9.4 10/29/2011 0939   PROT 6.6 10/29/2011 0939   ALBUMIN 4.3 10/29/2011 0939   AST 18 10/29/2011 0939   ALT 17 10/29/2011 0939   ALKPHOS 19* 10/29/2011 0939   BILITOT 0.4 10/29/2011 0939   GFRNONAA >60 01/30/2009 1040   GFRAA  Value: >60        The eGFR has been calculated using the MDRD equation. This calculation has not been validated in all clinical situations. eGFR's persistently <60 mL/min signify possible Chronic Kidney Disease. 01/30/2009 1040    No results found for this basename: SPEP, UPEP,  kappa and lambda light chains    Lab Results  Component Value Date   WBC 5.2 10/29/2011   NEUTROABS 3.0 10/29/2011   HGB 13.5 10/29/2011   HCT 39.9 10/29/2011   MCV 90.1 10/29/2011   PLT 253 10/29/2011      Chemistry      Component Value Date/Time   NA 139 10/29/2011 0939   K 4.2 10/29/2011 0939   CL 102 10/29/2011 0939   CO2 28 10/29/2011 0939   BUN 13 10/29/2011 0939   CREATININE 0.72 10/29/2011 0939      Component Value Date/Time   CALCIUM 9.4 10/29/2011 0939   ALKPHOS 19* 10/29/2011 0939   AST 18 10/29/2011 0939   ALT 17 10/29/2011 0939   BILITOT 0.4 10/29/2011 0939     ASSESSMENT: T1, N0, M0 melanoma  PLAN:  #1 melanoma We'll continue history, physical examination on a yearly basis without imaging study work. We won't order additional investigation with blood work and imaging study if she developed new signs and symptoms to suggest disease recurrence #2 vitamin D deficiency I recommend she take higher dose of vitamin D. The patient has been forgetful about taking a vitamin D supplement. The most recent recheck of vitamin D in July is just barely above the lower limits of normal.  All questions were answered. The patient knows to  call the clinic with any problems, questions or concerns. We can certainly see the patient much sooner if necessary. No barriers to learning was detected. I spent 15 minutes counseling the patient face to face. The total time spent in the appointment was 20 minutes and more than 50% was on counseling and review of test results     [facility], Yailene Badia, MD 12/27/2012 4:56 PM

## 2012-12-29 ENCOUNTER — Telehealth: Payer: Self-pay | Admitting: Hematology and Oncology

## 2012-12-29 NOTE — Telephone Encounter (Signed)
lvm for pt regarding to OCT 2015 appt...mailed pt avs/letter

## 2013-01-06 ENCOUNTER — Other Ambulatory Visit: Payer: 59 | Admitting: Lab

## 2013-01-06 ENCOUNTER — Ambulatory Visit: Payer: 59 | Admitting: Hematology and Oncology

## 2013-01-26 ENCOUNTER — Other Ambulatory Visit: Payer: Self-pay

## 2013-08-07 ENCOUNTER — Telehealth: Payer: Self-pay | Admitting: Hematology and Oncology

## 2013-08-07 ENCOUNTER — Other Ambulatory Visit: Payer: Self-pay | Admitting: Certified Nurse Midwife

## 2013-08-07 ENCOUNTER — Encounter: Payer: Self-pay | Admitting: Gynecology

## 2013-08-07 ENCOUNTER — Ambulatory Visit (INDEPENDENT_AMBULATORY_CARE_PROVIDER_SITE_OTHER): Payer: 59 | Admitting: Gynecology

## 2013-08-07 ENCOUNTER — Telehealth: Payer: Self-pay | Admitting: Certified Nurse Midwife

## 2013-08-07 VITALS — BP 146/80 | HR 76 | Resp 18 | Ht 64.5 in | Wt 144.0 lb

## 2013-08-07 DIAGNOSIS — N950 Postmenopausal bleeding: Secondary | ICD-10-CM

## 2013-08-07 DIAGNOSIS — N644 Mastodynia: Secondary | ICD-10-CM

## 2013-08-07 NOTE — Telephone Encounter (Signed)
Patient needs OV for evaluation and endometrial biopsy per consult with Dr. Charlies Constable. Please schedule patient soon.Order in for biopsy.

## 2013-08-07 NOTE — Telephone Encounter (Signed)
Spoke with patient. Advised of message from [place]. Patient agreeable and verbalizes understanding. Appointment scheduled for today at 5:30pm with Dr.Lathrop (time per Gay Filler). Advised patient can take 800mg  of mortrin/ibuprofen one hour before coming in for procedure to reduce cramping. Patient states that she can not take PO NSAIDS due to stomach condition and would like another med be provided or recommended. Advised would speak with provider and give patient a call back. Notified of benefits for procedure and that her OOP costs will be 157.54. Patient states that she would like to have this double checked when she arrive for appointment as there was an error before and she wants to make sure that it was run properly.

## 2013-08-07 NOTE — Progress Notes (Addendum)
Pt called today to report some PMB.  She has been on angeliq for several years without issues but has missed several days due to travel and is now bleeding red.  Pt offered EMB to evaluate and consent obtained. In addition, pt reports right lateral breast tenderness, she returns back to [place] 09/07/13.  She does not report feeling anything.  She states that she felt a return of hot flashes in January Pt lives in [place]  Procedure outlined and consent obtained.  Endometrial Biopsy Procedure Note  Pre-operative Diagnosis:PMB  Post-operative Diagnosis: same  Indications: bleeding on postmenopausal hormone replacement  Procedure Details   Urine pregnancy test was not done.  The risks (including infection, bleeding, pain, and uterine perforation) and benefits of the procedure were explained to the patient and Written informed consent was obtained.  Antibiotic prophylaxis against endocarditis was not indicated.   The patient was placed in the dorsal lithotomy position.  Bimanual exam showed the uterus to be in the retroflexed position.  A Graves' speculum inserted in the vagina, and the cervix prepped with povidone iodine.  Endocervical curettage with a Kevorkian curette was not performed.  Xylocaine jelly placed endocervix and anterior lip A sharp tenaculum was applied to the anterior lip of the cervix for stabilization.  A sterile uterine sound was used to sound the uterus to a depth of 7cm.  A Pipelle endometrial aspirator was used to sample the endometrium.  Moderate tissue obtained, Sample was sent for pathologic examination. BME afterwards, nontender.  Condition: Stable  Complications: None  Plan:  The patient was advised to call for any fever or for prolonged or severe pain or bleeding. She was advised to use OTC acetaminophen as needed for mild to moderate pain. She was advised to avoid vaginal intercourse for 48 hours or until the bleeding has completely stopped.   POLYPOID PORTION  OF SUPERFICIAL ENDOMETRIUM WITH DECIDUOID STROMAL APPEARANCE SUGGESTIVE OF EXOGENOUS PROGESTERONE EFFECT. -NUMEROUS STRIPS OF ENDOMETRIAL GLANDULAR EPITHELIUM WITH CILIARY METAPLASIA AND EOSINOPHILIC METAPLASIA; SCANT INTACT ENDOMETRIUM PRESENT. -NEGATIVE FOR MALIGNANCY OR ATYPIA.    Addendum: Pt examined supine and upright  Subjective:     Kristina Delgado is an 58 y.o. female who presents for evaluation of a breast mass. Change was noted 4 months ago, and has been stable since first identified. Patient does not routinely do self breast exams.  The mass is tender. Patient denies nipple discharge. Breast cancer risk factors include: none.    Objective:    Breasts: normal appearance, no masses or tenderness, lateral at 10 o'clock dense breast tissue c/w area of tenderness on right    Left negative  negative axillary, supraclavicular LN bilaterally Assessment:    breast mass, likely benign    Plan:    Arranged for mammogram. Arranged for ultrasound.

## 2013-08-07 NOTE — Telephone Encounter (Signed)
Patient is having some irregular bleeding started light bleeding it is bright red. Also having some cramping.

## 2013-08-07 NOTE — Telephone Encounter (Signed)
Spoke with patient. Advised patient she can take Tylenol before coming in for procedure this afternoon per Dr.Lathrop. Patient states that she did not have much luck with Tylenol reducing cramping when she was having menses but that she will take it and see if it helps.   Routing to provider for final review. Patient agreeable to disposition. Will close encounter

## 2013-08-07 NOTE — Telephone Encounter (Signed)
Spoke with patient. Patient states that she has been on HRT for years. Patient recently traveled out of the country to [place] and [place]. Will traveling patient got a GI bug and was vomiting. Patient states that she may have thrown up a couple of her pills and may have missed some due to time change. Patient woke up this morning and is having bright red bleeding with cramping. Patient states that bleeding is light. Patient would like to know if she should continue to take HRT as she normally would or if she should wait for the bleeding to stop and then begin again. Advised would send a message over to Regina Eck CNM and give patient a call back with further instructions and recommendations. Patient agreeable.

## 2013-08-07 NOTE — Telephone Encounter (Signed)
returned pt call and r/s appt from OCT due to pt not going o be in town

## 2013-08-08 ENCOUNTER — Ambulatory Visit: Payer: 59 | Admitting: Certified Nurse Midwife

## 2013-08-08 ENCOUNTER — Telehealth: Payer: Self-pay | Admitting: Emergency Medicine

## 2013-08-08 DIAGNOSIS — N644 Mastodynia: Secondary | ICD-10-CM

## 2013-08-08 DIAGNOSIS — N6310 Unspecified lump in the right breast, unspecified quadrant: Secondary | ICD-10-CM

## 2013-08-08 NOTE — Telephone Encounter (Signed)
Message copied by Adrin Julian L on Tue Aug 08, 2013 11:36 AM ------      Message from: LATHROP, [place] Perry      Created: Tue Aug 08, 2013  8:49 AM       Saw this pt after hours last night, she lives in [place], will be going back 6/18? She complains of right breast tenderness and fullness, I ordered a dx mammo but cannot order u/s, can you call and arrange?      Thanks,      Bryam Taborda ------ 

## 2013-08-08 NOTE — Telephone Encounter (Signed)
Routing to Dr. Charlies Constable to review and advise. Patient is currently on Angeliq 0.5 mg. Do you want to change any of her medications? I did not see that you added any medications during office visit.

## 2013-08-08 NOTE — Telephone Encounter (Signed)
Message copied by Michele Mcalpine on Tue Aug 08, 2013 11:36 AM ------      Message from: Elveria Rising      Created: Tue Aug 08, 2013  8:49 AM       Saw this pt after hours last night, she lives in [place], will be going back 6/18? She complains of right breast tenderness and fullness, I ordered a dx mammo but cannot order u/s, can you call and arrange?      Thanks,      Olivia Mackie ------

## 2013-08-08 NOTE — Telephone Encounter (Signed)
Patient with bilateral screening exam 11/10/12. Will need right Breast Dx Mammogram and R breast US.  She is scheduled for 08/10/13 at 1:15. Mammogram hold placed. Spoke with patient. She is insistent on bilateral dx mammogram as she lives out of the country and states that insurance coverage is one mammogram per calendar year. I advised that I would let the [facility] know her wishes and place order. Spoke with [facility] and they state that the radiologists prefer not to do dx mammogram prior to when screening is due for [facility].  Reordered bilateral dx mammogram. They will contact patient to discuss.

## 2013-08-08 NOTE — Addendum Note (Signed)
Addended by: Michele Mcalpine on: 08/08/2013 11:27 AM   Modules accepted: Orders

## 2013-08-08 NOTE — Telephone Encounter (Signed)
Patient forgot to ask Dr. Charlies Constable yesterday if she should continue with medication to control bleeding. Patient is asking if she needs to stay on the dose or double?

## 2013-08-10 ENCOUNTER — Ambulatory Visit
Admission: RE | Admit: 2013-08-10 | Discharge: 2013-08-10 | Disposition: A | Discharge status: Home / Self Care | Payer: 59 | Source: Ambulatory Visit | Attending: Gynecology | Admitting: Gynecology

## 2013-08-10 ENCOUNTER — Other Ambulatory Visit: Payer: Self-pay | Admitting: Gynecology

## 2013-08-10 DIAGNOSIS — N644 Mastodynia: Secondary | ICD-10-CM

## 2013-08-10 DIAGNOSIS — N6310 Unspecified lump in the right breast, unspecified quadrant: Secondary | ICD-10-CM

## 2013-08-10 LAB — IPS OTHER TISSUE BIOPSY

## 2013-08-10 NOTE — Telephone Encounter (Signed)
Can inform biopsy is negative.  I think best to let Dr. Charlies Constable make recommendations.

## 2013-08-10 NOTE — Telephone Encounter (Signed)
Dr. Charlies Constable, Patient is calling again to confirm that she should be taking Angeliq 0.5 mg daily. She states that her vaginal bleeding "is about the same." Describes as a light flow. She feels that when she had abnormal vaginal bleeding prior (last pathology in Epic from 2010, but did not see notes in paper chart) that she made changes to her medications to stop bleeding. I advised to continue on Angeliq as instructed and not to increase dose without orders from provider.   Advised patient that I would have Dr. Charlies Constable review and return her call. She is also wishing to discuss her pathology results with Dr. Charlies Constable. They are resulted but we did not discuss.   Dr. Sabra Heck, can you advise as covering.   cc Dr. Charlies Constable

## 2013-08-11 NOTE — Telephone Encounter (Signed)
Message copied by Michele Mcalpine on Fri Aug 11, 2013  3:39 PM ------      Message from: Elveria Rising      Created: Fri Aug 11, 2013  7:31 AM       Inform mammo and u/s normal, agree with routine f/u ------

## 2013-08-11 NOTE — Telephone Encounter (Signed)
Message copied by Michele Mcalpine on Fri Aug 11, 2013  3:40 PM ------      Message from: Elveria Rising      Created: Fri Aug 11, 2013  7:31 AM       Inform mammo and u/s normal, agree with routine f/u ------

## 2013-08-11 NOTE — Telephone Encounter (Signed)
Notes Recorded by Azalia Bilis, MD on 08/11/2013 at 7:34 AM Inform biopsy was benign and she can resume the angeliq and eventually it will stop or she can try to be off of it and resume meds once it has stopped but there is no need to change medications, most likely bleeding is related to her missed pills

## 2013-08-11 NOTE — Telephone Encounter (Signed)
Patient notified of both messages from Dr. Charlies Constable. Patient is agreeable. She will call back with any further concerns.  Will close encounter.

## 2013-08-17 ENCOUNTER — Telehealth: Payer: Self-pay | Admitting: Certified Nurse Midwife

## 2013-08-17 NOTE — Telephone Encounter (Signed)
Patient is calling because she has started some light bleeding last night. She had an biopsy last Monday.

## 2013-08-17 NOTE — Telephone Encounter (Signed)
Spoke with patient at time of incoming call. She states that her vaginal bleeding stopped initially after we spoke on 08/11/13 but now she is concerned that today after lunch she started with spotting/bleeding again. Wearing a liner only. States that she has been taking her Angeliq "religiously" and "I was doing fine, but last night things smelled different and I started bleeding again today after lunch."  C/O feeling menstrual cramps that she rates as a 3/10 and not taking any home medications for, states she cannot take Motrin.  Denies vaginal discharge or odors at this time, denies fevers. She states that the area of mons where her pubic hair "has been itching like crazy" as well, but that is only intermittent, suggested hydrocortisone ointment to help with any itching on skin area. Patient states as she did in prior conversations that she feels she "doubled up" on her Angeliq in order to stop bleeding, we discussed again message from Dr. Charlies Constable to not change any medications at this time and bleeding should resolve with continued use of pills.  Patient declines office visit at this time, but feels she cannot wait until annual exam as scheduled for 08/31/13 with Regina Eck CNM to address her current symptoms.  Patient feels that these issues may be related to her angeliq and that she feels that product has changed and that she remembers she was on yellow pills but now they are pink. Patient states that pills come from the [place] even though she lives in [place]. She does not have packaging of pills with her to compare.   I reccommended that patient be evaluated by a Doctor as she has multiple concerns. She agrees to appointment but would like to know, again, if she should make changes to her medications.  I advised I would send her message to doctor covering Dr. Sabra Heck and return her call. She is agreeable.

## 2013-08-18 ENCOUNTER — Encounter: Payer: Self-pay | Admitting: Obstetrics & Gynecology

## 2013-08-18 ENCOUNTER — Ambulatory Visit (INDEPENDENT_AMBULATORY_CARE_PROVIDER_SITE_OTHER): Payer: 59 | Admitting: Obstetrics & Gynecology

## 2013-08-18 VITALS — BP 136/76 | HR 88 | Resp 20 | Ht 64.5 in | Wt 147.0 lb

## 2013-08-18 DIAGNOSIS — N8501 Benign endometrial hyperplasia: Secondary | ICD-10-CM

## 2013-08-18 HISTORY — DX: Benign endometrial hyperplasia: N85.01

## 2013-08-18 MED ORDER — DROSPIRENONE-ESTRADIOL 0.25-0.5 MG PO TABS: 1.0000 | ORAL_TABLET | Freq: Every day | ORAL | Status: DC

## 2013-08-18 NOTE — Telephone Encounter (Signed)
Will discuss at [place].  Encounter closed.

## 2013-08-18 NOTE — Progress Notes (Signed)
Subjective:     Patient ID: Kristina Delgado, female   DOB: 1956-02-25, 58 y.o.   MRN: [id number]  HPI 58 yo G3p2 MWF here for PMP bleeding that started after missing a few doses of her HRT.  Also she has felt there was something different about her pills.  She is having more moodiness and breast tenderness and breast fullness.  Currently taking a pink pill and she was on a yellow pill.  I checked boxes and the pink pill is the 1/0.5mg  and the yellow pill is the 0.5/0.25mg  dosage.  She states the pill color changed in January.  He last rx from [place] was in July of last year.    Endometrial biopsy 08/07/13 was negative.  Progesterone effect was noted.  Having a little cramping and some mild discharge.  No fevers.  No back pain.  Review of Systems     Objective:   Physical Exam  Constitutional: She is oriented to person, place, and time. She appears well-developed and well-nourished.  Abdominal: Soft. Bowel sounds are normal.  Genitourinary: Vagina normal and uterus normal. There is no rash or tenderness on the right labia. There is no rash or tenderness on the left labia. Cervix exhibits no motion tenderness, no discharge and no friability. Right adnexum displays no tenderness and no fullness. Left adnexum displays no tenderness and no fullness.  Lymphadenopathy:       Right: No inguinal adenopathy present.       Left: No inguinal adenopathy present.  Neurological: She is alert and oriented to person, place, and time.  Skin: Skin is warm and dry.  Psychiatric: She has a normal mood and affect.       Assessment:     DUB post hysteroscopy, appropriate post surgical changes Recurrent yeast, no evidence of this today H/O simple endometrial hyperplasia 2010 with Dr. Ree Edman     Plan:     Change HRT back to what she was taking before [place]--Kristina Delgado 0.5 estradiol/0.25 drosperinone dosing.  #1 mo/1RF and 90 day supply with 4RF for mail order given.  May bleed with this change.  If calls  back with bleeding, will plan PUS.  F/u 2 weeks for AEX.  Pt will return to see me if possible

## 2013-08-21 ENCOUNTER — Telehealth: Payer: Self-pay | Admitting: Obstetrics & Gynecology

## 2013-08-21 DIAGNOSIS — N938 Other specified abnormal uterine and vaginal bleeding: Secondary | ICD-10-CM

## 2013-08-21 NOTE — Telephone Encounter (Signed)
Spoke with patient. Patient states that she was seen on 5/18 and had an EMB done. Then was seen on 5/22 by Dr.Miller for continued bleeding. Patient's rx was switched back to Angeliq 0.5 estradiol/0.25 drosperinone dosing. Patient states that Friday night she began to having light spotting which turned into light bleeding on Saturday morning. Patient is still currently bleeding. Wearing a panti liner which she changes a couple of times per day. Denies pain. Patient states she was told to call back if bleeding occurred again. Per Dr.Miller's note patient can expect bleeding with rx which. Patient advised of this. Dr.Miller recommends a PUS. Patient is agreeable and would like to schedule as she flys back to [place] on 6/18 and would like to have it done before then "Just in case I need something else done after." PUS scheduled for 6/4 at 1500 with 1530 consult with Dr.Miller. Patient agreeable to date and time. Order placed for precert.  Routing to provider for final review. Patient agreeable to disposition. Will close encounter

## 2013-08-21 NOTE — Telephone Encounter (Signed)
Patient calling to speak with the nurse about still having bleeding. She was seen by Dr. Sabra Heck last week and was told to call. Please advise?

## 2013-08-24 ENCOUNTER — Ambulatory Visit (INDEPENDENT_AMBULATORY_CARE_PROVIDER_SITE_OTHER): Payer: 59 | Admitting: Obstetrics & Gynecology

## 2013-08-24 ENCOUNTER — Ambulatory Visit (INDEPENDENT_AMBULATORY_CARE_PROVIDER_SITE_OTHER): Payer: 59

## 2013-08-24 VITALS — BP 120/80 | Ht 64.5 in | Wt 146.0 lb

## 2013-08-24 DIAGNOSIS — N949 Unspecified condition associated with female genital organs and menstrual cycle: Secondary | ICD-10-CM

## 2013-08-24 DIAGNOSIS — R9389 Abnormal findings on diagnostic imaging of other specified body structures: Secondary | ICD-10-CM

## 2013-08-24 DIAGNOSIS — N925 Other specified irregular menstruation: Secondary | ICD-10-CM

## 2013-08-24 DIAGNOSIS — N938 Other specified abnormal uterine and vaginal bleeding: Secondary | ICD-10-CM

## 2013-08-24 DIAGNOSIS — N950 Postmenopausal bleeding: Secondary | ICD-10-CM

## 2013-08-24 MED ORDER — NORETHINDRONE ACETATE 5 MG PO TABS
5.0000 mg | ORAL_TABLET | Freq: Two times a day (BID) | ORAL | Status: DC
Start: 2013-08-24 — End: 2013-08-29

## 2013-08-24 MED ORDER — DROSPIRENONE-ESTRADIOL 0.25-0.5 MG PO TABS: 1.0000 | ORAL_TABLET | Freq: Every day | ORAL | Status: DC

## 2013-08-24 NOTE — Progress Notes (Signed)
58 y.o.Marriedfemale here for a pelvic ultrasound with sonohystogram due to continued PMP bleeding when dosage of pills was increased when order was done from our office in 7/14.  Pt didn't start receiving change in pills until [place] of this year.  She noted the color changed in the pills.  She and I figured out at her visit on 08/18/13 that this was the issue.  She lives in [place] and is very anxious that the bleeding continues.  It is not heavy but just enough to be a bother and worrisome.  Pt does have a hx of ismple endometrial hyperplasia diagnosed in 2010 by Dr. Ree Edman.  Endometrial biopsy performed 08/11/13 by Dr. Charlies Constable.    Pathology showed -POLYPOID PORTION OF SUPERFICIAL ENDOMETRIUM WITH DECIDUOID STROMAL APPEARANCE SUGGESTIVE OF EXOGENOUS PROGESTERONE EFFECT. -NUMEROUS STRIPS OF ENDOMETRIAL GLANDULAR EPITHELIUM WITH CILIARY METAPLASIA AND EOSINOPHILIC METAPLASIA; SCANT INTACT ENDOMETRIUM PRESENT. -NEGATIVE FOR MALIGNANCY OR ATYPIA.  Patient's last menstrual period was 05/22/2010.  Contraception: PMP state  Technique:  Both transabdominal and transvaginal ultrasound examinations of the pelvis were performed. Transabdominal technique was performed for global imaging of the pelvis including uterus, ovaries, adnexal regions, and pelvic cul-de-sac.  It was necessary to proceed with endovaginal exam following the abdominal ultrasound, transabdominal exam to visualize the endometrium and adnexa.  Color and duplex Doppler ultrasound was utilized to evaluate blood flow to the ovaries.    FINDINGS: Uterus: 6.2 x 4.4 x 4.6cm Endometrium: 5.33mm Adnexa:  Left: 1.5 x 1.2 x 0.5cm     Right: 2.2 x 0.8 x 1.0cm Cul de sac: no free fluid  SHSG:  After obtaining appropriate verbal consent from patient, the cervix was visualized using a speculum, and prepped with betadine.  A tenaculum  was applied to the cervix.  Dilation of the cervix was not necessary. The catheter was passed into the uterus and  sterile saline introduced, with the following findings: no filling defects noted  Images reviewed with pt.  I really do think this bleeding is related to the change in her HRT.  Will increase her progesterone for now to get the bleeding stopped.  Will then resume her standard 0.5mg  estradiol dosage in her Angeliq.  She voices clear understanding of plan.  Assessment:  PMP bleeding most likely due to HRT dosage change from 0.5mg  estradiol dosage to 1.0mg  dosage since January.  Negative endometrial biopsy 5/22. H/O simple endometrial hyperplasia 2010 with Dr. Ree Edman.  Plan:  Aygestin 5mg  BID until bleeding stops, then decrease to daily for another week, then off.  She is to continue her 0.5/0.25mg  Angeliq throughout this process.   F/u with me for AEX 09/05/13.  ~25 minutes spent with patient >50% of time was in face to face discussion of above.

## 2013-08-29 ENCOUNTER — Ambulatory Visit (HOSPITAL_BASED_OUTPATIENT_CLINIC_OR_DEPARTMENT_OTHER): Payer: 59 | Admitting: Hematology and Oncology

## 2013-08-29 ENCOUNTER — Encounter: Payer: Self-pay | Admitting: Hematology and Oncology

## 2013-08-29 VITALS — BP 150/82 | HR 91 | Temp 98.9°F | Resp 20 | Ht 64.5 in | Wt 149.3 lb

## 2013-08-29 DIAGNOSIS — Z85820 Personal history of malignant melanoma of skin: Secondary | ICD-10-CM

## 2013-08-29 DIAGNOSIS — E559 Vitamin D deficiency, unspecified: Secondary | ICD-10-CM | POA: Insufficient documentation

## 2013-08-29 DIAGNOSIS — C439 Malignant melanoma of skin, unspecified: Secondary | ICD-10-CM

## 2013-08-29 NOTE — Progress Notes (Signed)
[facility] OFFICE PROGRESS NOTE  Patient Care Team: Marcello Fennel, MD as PCP - General (Family Medicine)  SUMMARY OF ONCOLOGIC HISTORY: This is a pleasant lady with excessive sun exposure in the past, had numerous skin lesions removed over the years. In 2006, she was noted to have an abnormal lesion on her upper back which came back melanoma. She underwent a wide local excision followed by sentinel lymph node biopsy which came back negative. Overall staging is T1 A. N0 M0 melanoma. The patient travels a lot but continue to see [place] on a yearly basis and her dermatologist regularly. She resides in [place] for most part of the year. She has been using skin protection and avoiding excessive sun exposure. INTERVAL HISTORY: Kristina Delgado 58 y.o. female returns for further followup visit related to her history of melanoma. She denies any new skin lesions.  She take vitamin D supplement regularly. All her other screening program for up to date The only main complaint is recent overdose on hormone replacement therapy causing breast tenderness, fluid retention and vaginal bleeding. Further evaluation excluded diagnosis of cancer. Please see below for problem oriented charting.  REVIEW OF SYSTEMS:   Constitutional: Denies fevers, chills or abnormal weight loss Eyes: Denies blurriness of vision Ears, nose, mouth, throat, and face: Denies mucositis or sore throat Respiratory: Denies cough, dyspnea or wheezes Cardiovascular: Denies palpitation, chest discomfort or lower extremity swelling Gastrointestinal:  Denies nausea, heartburn or change in bowel habits Skin: Denies abnormal skin rashes Lymphatics: Denies new lymphadenopathy or easy bruising Neurological:Denies numbness, tingling or new weaknesses Behavioral/Psych: Mood is stable, no new changes  All other systems were reviewed with the patient and are negative.  I have reviewed the past medical history, past surgical history,  social history and family history with the patient and they are unchanged from previous note.  ALLERGIES:  is allergic to latex; sulfa antibiotics; and tegaderm ag mesh.  MEDICATIONS:  Current Outpatient Prescriptions  Medication Sig Dispense Refill  . ALPRAZolam (XANAX) 0.25 MG tablet Take 0.25 mg by mouth as needed.      Marland Kitchen BEPREVE 1.5 % SOLN 2 (two) times daily.      Marland Kitchen dexlansoprazole (DEXILANT) 60 MG capsule Take 60 mg by mouth daily.      Marland Kitchen DYMISTA 137-50 MCG/ACT SUSP 2 (two) times daily.      Marland Kitchen EPINEPHrine (EPIPEN 2-PAK IJ) Inject as directed.      . hyoscyamine (LEVSIN, ANASPAZ) 0.125 MG tablet Take 0.125 mg by mouth as needed.      Marland Kitchen Ketoconazole-Hydrocortisone (KETOCON EX) Apply topically as needed. cream      . montelukast (SINGULAIR) 10 MG tablet daily.      . norethindrone (AYGESTIN) 5 MG tablet Take 5 mg by mouth daily.      . simvastatin (ZOCOR) 40 MG tablet Take 40 mg by mouth every evening.      . zolpidem (AMBIEN) 10 MG tablet Take 5 mg by mouth at bedtime as needed for sleep.       No current facility-administered medications for this visit.    PHYSICAL EXAMINATION: ECOG PERFORMANCE STATUS: 0 - Asymptomatic  Filed Vitals:   08/29/13 1543  BP: 150/82  Pulse: 91  Temp: 98.9 F (37.2 C)  Resp: 20   Filed Weights   08/29/13 1543  Weight: 149 lb 4.8 oz (67.722 kg)    GENERAL:alert, no distress and comfortable SKIN: skin color, texture, turgor are normal, no rashes or significant lesions. Careful  examination of the skin did not reveal any new abnormalities. EYES: normal, Conjunctiva are pink and non-injected, sclera clear OROPHARYNX:no exudate, no erythema and lips, buccal mucosa, and tongue normal  NECK: supple, thyroid normal size, non-tender, without nodularity LYMPH:  no palpable lymphadenopathy in the cervical, axillary or inguinal LUNGS: clear to auscultation and percussion with normal breathing effort HEART: regular rate & rhythm and no murmurs and no  lower extremity edema ABDOMEN:abdomen soft, non-tender and normal bowel sounds Musculoskeletal:no cyanosis of digits and no clubbing  NEURO: alert & oriented x 3 with fluent speech, no focal motor/sensory deficits Bilateral breast examination did not reveal any abnormalities. LABORATORY DATA:  I have reviewed the data as listed    Component Value Date/Time   NA 139 10/29/2011 0939   K 4.2 10/29/2011 0939   CL 102 10/29/2011 0939   CO2 28 10/29/2011 0939   GLUCOSE 66* 10/29/2011 0939   BUN 13 10/29/2011 0939   CREATININE 0.72 10/29/2011 0939   CALCIUM 9.4 10/29/2011 0939   PROT 6.6 10/29/2011 0939   ALBUMIN 4.3 10/29/2011 0939   AST 18 10/29/2011 0939   ALT 17 10/29/2011 0939   ALKPHOS 19* 10/29/2011 0939   BILITOT 0.4 10/29/2011 0939   GFRNONAA >60 01/30/2009 1040   GFRAA  Value: >60        The eGFR has been calculated using the MDRD equation. This calculation has not been validated in all clinical situations. eGFR's persistently <60 mL/min signify possible Chronic Kidney Disease. 01/30/2009 1040    No results found for this basename: SPEP,  UPEP,   kappa and lambda light chains    Lab Results  Component Value Date   WBC 5.2 10/29/2011   NEUTROABS 3.0 10/29/2011   HGB 13.5 10/29/2011   HCT 39.9 10/29/2011   MCV 90.1 10/29/2011   PLT 253 10/29/2011      Chemistry      Component Value Date/Time   NA 139 10/29/2011 0939   K 4.2 10/29/2011 0939   CL 102 10/29/2011 0939   CO2 28 10/29/2011 0939   BUN 13 10/29/2011 0939   CREATININE 0.72 10/29/2011 0939      Component Value Date/Time   CALCIUM 9.4 10/29/2011 0939   ALKPHOS 19* 10/29/2011 0939   AST 18 10/29/2011 0939   ALT 17 10/29/2011 0939   BILITOT 0.4 10/29/2011 0939     ASSESSMENT & PLAN:  Melanoma Clinically, she has no signs of disease recurrence. I continue to reinforce the importance of yearly examination by a dermatologist. Reinforced the importance of skin protection.  Unspecified vitamin D deficiency  I recommend vitamin D supplementation.     All  questions were answered. The patient knows to call the clinic with any problems, questions or concerns. No barriers to learning was detected. I spent 15 minutes counseling the patient face to face. The total time spent in the appointment was 20 minutes and more than 50% was on counseling and review of test results     Heath Lark, MD 08/29/2013 9:49 PM

## 2013-08-29 NOTE — Assessment & Plan Note (Signed)
Clinically, she has no signs of disease recurrence. I continue to reinforce the importance of yearly examination by a dermatologist. Reinforced the importance of skin protection. 

## 2013-08-29 NOTE — Assessment & Plan Note (Signed)
I recommend vitamin D supplementation.

## 2013-08-31 ENCOUNTER — Ambulatory Visit: Payer: 59 | Admitting: Certified Nurse Midwife

## 2013-09-01 ENCOUNTER — Ambulatory Visit: Payer: 59 | Admitting: Obstetrics & Gynecology

## 2013-09-03 ENCOUNTER — Encounter: Payer: Self-pay | Admitting: Obstetrics & Gynecology

## 2013-09-04 ENCOUNTER — Telehealth: Payer: Self-pay

## 2013-09-04 NOTE — Telephone Encounter (Signed)
Lmtcb//kn 

## 2013-09-04 NOTE — Telephone Encounter (Signed)
Patients appointment has been rescheduled with DL//kn

## 2013-09-04 NOTE — Telephone Encounter (Signed)
She took the appt with Debbi okay to close kelly

## 2013-09-05 ENCOUNTER — Encounter: Payer: Self-pay | Admitting: Certified Nurse Midwife

## 2013-09-05 ENCOUNTER — Ambulatory Visit: Payer: 59 | Admitting: Obstetrics & Gynecology

## 2013-09-05 ENCOUNTER — Ambulatory Visit (INDEPENDENT_AMBULATORY_CARE_PROVIDER_SITE_OTHER): Payer: 59 | Admitting: Certified Nurse Midwife

## 2013-09-05 VITALS — BP 104/68 | HR 68 | Resp 16 | Ht 64.25 in | Wt 150.0 lb

## 2013-09-05 DIAGNOSIS — Z01419 Encounter for gynecological examination (general) (routine) without abnormal findings: Secondary | ICD-10-CM

## 2013-09-05 DIAGNOSIS — Z124 Encounter for screening for malignant neoplasm of cervix: Secondary | ICD-10-CM

## 2013-09-05 DIAGNOSIS — Z0000 Encounter for general adult medical examination without abnormal findings: Secondary | ICD-10-CM

## 2013-09-05 DIAGNOSIS — R319 Hematuria, unspecified: Secondary | ICD-10-CM

## 2013-09-05 DIAGNOSIS — N951 Menopausal and female climacteric states: Secondary | ICD-10-CM

## 2013-09-05 LAB — POCT URINALYSIS DIPSTICK
BILIRUBIN UA: NEGATIVE
GLUCOSE UA: NEGATIVE
Ketones, UA: NEGATIVE
Leukocytes, UA: NEGATIVE
NITRITE UA: NEGATIVE
Protein, UA: NEGATIVE
Urobilinogen, UA: NEGATIVE
pH, UA: 5

## 2013-09-05 LAB — HEMOGLOBIN, FINGERSTICK: HEMOGLOBIN, FINGERSTICK: 13.1 g/dL (ref 12.0–16.0)

## 2013-09-05 MED ORDER — DROSPIRENONE-ESTRADIOL 0.25-0.5 MG PO TABS: 1.0000 | ORAL_TABLET | Freq: Every day | ORAL | Status: DC

## 2013-09-05 NOTE — Progress Notes (Signed)
58 y.o. [id number] Married Caucasian Fe here for annual exam.  Menopausal on Angeliq, working well. Started on 08/24/13 bid x x 6 days, bleeding stopped and then dropped down to once daily for week. Patient had breast tenderness and bloating prior to bleeding. Patient had bowel leakage with flare with IBS. Has not had another occurrence. Patient had urology work up for microscopic hematuria, all negative. Will be leaving and going back to [place] in 2 weeks. Recent visit with PCP with labs all normal. No other health issues today.  Patient's last menstrual period was 08/08/2013.          Sexually active: yes  The current method of family planning is vasectomy.    Exercising: yes  walking Smoker:  no  Health Maintenance: Pap: 10-23-11 neg HPV HR neg MMG: 08-10-13 rt breast u/s neg, 11-09-12 neg Colonoscopy: 2011 in 5 years BMD:  2006 TDaP:  2006 Labs: Poct urine-rbc-tr, Hgb-13.1 Self breast exam:done occ   reports that she has never smoked. She has never used smokeless tobacco. She reports that she drinks about 6 ounces of alcohol per week. She reports that she does not use illicit drugs.  Past Medical History  Diagnosis Date  . Skin cancer 09/30/2004    malignant melanoma.  . Hypertension     previous hx  . Migraine   . Simple endometrial hyperplasia without atypia 08/18/2013  . Rectal leakage   . IBS (irritable bowel syndrome)     Past Surgical History  Procedure Laterality Date  . Cholecystectomy  1980  . Wrist surgery      rt wrist  . Shoulder arthroscopy      left 2011/right 2012  . Other surgical history      full buttock revision  . Melanoma excision  2006  . Endometrial biopsy  2010    negative atypia/hyperplasia  . Cervical cerclage      Current Outpatient Prescriptions  Medication Sig Dispense Refill  . ALPRAZolam (XANAX) 0.25 MG tablet Take 0.25 mg by mouth as needed.      . ANGELIQ 0.25-0.5 MG TABS daily.      Marland Kitchen BEPREVE 1.5 % SOLN 2 (two) times daily.      Marland Kitchen  dexlansoprazole (DEXILANT) 60 MG capsule Take 60 mg by mouth daily.      Marland Kitchen DYMISTA 137-50 MCG/ACT SUSP 2 (two) times daily.      . hyoscyamine (LEVSIN, ANASPAZ) 0.125 MG tablet Take 0.125 mg by mouth as needed.      Marland Kitchen ketoconazole (NIZORAL) 2 % cream 2 (two) times daily.      . montelukast (SINGULAIR) 10 MG tablet daily.      . norethindrone (AYGESTIN) 5 MG tablet Take 5 mg by mouth daily.      . simvastatin (ZOCOR) 40 MG tablet Take 40 mg by mouth every evening.      . zolpidem (AMBIEN) 10 MG tablet Take 5 mg by mouth at bedtime as needed for sleep.       No current facility-administered medications for this visit.    Family History  Problem Relation Age of Onset  . Breast cancer Mother   . Cancer Mother     bladder cancer,  precancerous moles.  . Hypertension Mother   . Heart disease Mother   . Cancer Sister     has atypical moles.  . Diabetes Sister   . Scleroderma Sister   . Cancer Father     bladder  . Hypertension Father   .  Alcohol abuse Brother     ROS:  Pertinent items are noted in HPI.  Otherwise, a comprehensive ROS was negative.  Exam:   BP 104/68  Pulse 68  Resp 16  Ht 5' 4.25" (1.632 m)  Wt 150 lb (68.04 kg)  BMI 25.55 kg/m2  LMP 08/08/2013 Height: 5' 4.25" (163.2 cm)  Ht Readings from Last 3 Encounters:  09/05/13 5' 4.25" (1.632 m)  08/29/13 5' 4.5" (1.638 m)  08/24/13 5' 4.5" (1.638 m)    General appearance: alert, cooperative and appears stated age Head: Normocephalic, without obvious abnormality, atraumatic Neck: no adenopathy, supple, symmetrical, trachea midline and thyroid normal to inspection and palpation and non-palpable Lungs: clear to auscultation bilaterally Breasts: normal appearance, no masses or tenderness, No nipple retraction or dimpling, No nipple discharge or bleeding, No axillary or supraclavicular adenopathy Heart: regular rate and rhythm Abdomen: soft, non-tender; no masses,  no organomegaly Extremities: extremities normal,  atraumatic, no cyanosis or edema Skin: Skin color, texture, turgor normal. No rashes or lesions Lymph nodes: Cervical, supraclavicular, and axillary nodes normal. No abnormal inguinal nodes palpated Neurologic: Grossly normal   Pelvic: External genitalia:  no lesions              Urethra:  normal appearing urethra with no masses, tenderness or lesions              Bartholin's and Skene's: normal                 Vagina: normal appearing vagina with normal color and discharge, no lesions              Cervix: normal, non tender              Pap taken: yes Bimanual Exam:  Uterus:  normal size, contour, position, consistency, mobility, non-tender and mid position              Adnexa: normal adnexa and no mass, fullness, tenderness               Rectovaginal: Confirms               Anus:  normal sphincter tone, no lesions  A:  Well Woman with normal exam  Menopausal on HRT working well  History of PMB with PUS and with benign endometrial biopsy on Aygestin, no further bleeding, need to consult for cessation or continuation for one more week.  P:   Reviewed health and wellness pertinent to exam  Aware of need to notify if vaginal bleeding occurs again. Patient desires continuation of HRT.  Rx Angeliq see order  Continue follow up with PCP aa indicated   Pap smear taken today with HPV reflex  Lab: Urine micro( trace RBC history of)  CBC   counseled on breast self exam, mammography screening, adequate intake of calcium and vitamin D, diet and exercise  return annually or prn  An After Visit Summary was printed and given to the patient.

## 2013-09-05 NOTE — Patient Instructions (Signed)

## 2013-09-06 ENCOUNTER — Telehealth: Payer: Self-pay

## 2013-09-06 LAB — CBC
HCT: 38.8 % (ref 36.0–46.0)
Hemoglobin: 13.2 g/dL (ref 12.0–15.0)
MCH: 30.3 pg (ref 26.0–34.0)
MCHC: 34 g/dL (ref 30.0–36.0)
MCV: 89.2 fL (ref 78.0–100.0)
Platelets: 299 10*3/uL (ref 150–400)
RBC: 4.35 MIL/uL (ref 3.87–5.11)
RDW: 13.5 % (ref 11.5–15.5)
WBC: 7 10*3/uL (ref 4.0–10.5)

## 2013-09-06 NOTE — Progress Notes (Signed)
Please have pt continue Aygestin until she has had 2-3 weeks of no vaginal bleeding.  Inform her she may bleed again when it stops but this will be light and short and then she should be complete "reset".    Reviewed personally.  Felipa Emory, MD.

## 2013-09-06 NOTE — Telephone Encounter (Signed)
Pt is returning a call to Joy 

## 2013-09-06 NOTE — Progress Notes (Signed)
Please notify patient to continue Aygestin until she has had 2-3 weeks of no bleeding. She may bleed again but should be light and short to  Complete reset her hormones

## 2013-09-06 NOTE — Telephone Encounter (Signed)
Pt notified & states she is not having any problems right now. Pt states you said you would give her some directions for the progesterone. She would like you to send them to her on mychart

## 2013-09-06 NOTE — Telephone Encounter (Signed)
Called patient to let her know that her urine was not sent out. Per Laurena Slimmer has had a hx of rbc with negative urology workup. If patient has any urinary symptoms,she needs to call the office. LMTCB

## 2013-09-07 NOTE — Progress Notes (Signed)
Pt notified as written by providers. Pt will call us if she has any bleeding just so we can have it documented.

## 2013-09-07 NOTE — Telephone Encounter (Signed)
Pt notified of instruction to continue aygestin until she has 2-3 weeks of no bleeding & that when she stops it that she may have some bleeding but it should be short & light to reset her hormones. Pt will call us if she has any bleeding so it can be documented. See CC'd chart note

## 2013-09-08 LAB — IPS PAP TEST WITH REFLEX TO HPV

## 2013-09-15 ENCOUNTER — Telehealth: Payer: Self-pay | Admitting: Obstetrics & Gynecology

## 2013-09-15 DIAGNOSIS — N951 Menopausal and female climacteric states: Secondary | ICD-10-CM

## 2013-09-15 MED ORDER — DROSPIRENONE-ESTRADIOL 0.25-0.5 MG PO TABS: 1.0000 | ORAL_TABLET | Freq: Every day | ORAL | Status: DC

## 2013-09-15 NOTE — Telephone Encounter (Signed)
Spoke with patient at time of incoming call. Patient has paper rx for year supply of Angeliq 0.25-0.5 mg to take to [place] with her, she trip has been delayed so she needs one month to local pharmacy. Order sent to [facility] on [address].   Patient also states that she has followed instructions from Dr. Sabra Heck and has taken angeliq 0.25-0.5 mg along with Aygestin 5 mg. She has been on Aygestin and stopped bleeding on 08/28/13. Took Aygestin 5 mg for two weeks until 09/11/13. On 09/14/13 developed vaginal bleeding that patient feels is heavier than before and have some cramping with this bleeding. She is currently changing pad 3-4 times per day. She states she is aware she was to expect bleeding after stopping Aygestin and wanted to advised Dr. Sabra Heck that she is having heavier bleeding than she thought she would. As patient has been having bleeding one day, advised can monitor over the weekend and call back Monday with update, as bleeding should taper. Patient is agreeable to this, verbalizes understanding of bleeding emergencies. Advised patient to call back or seek immediate medical care if bleeding worsens or soaking through 1 pad/tampon per hour.  Patient verbalized understanding and will call Monday with update.

## 2013-09-15 NOTE — Telephone Encounter (Deleted)
Dr.Miller, patient is menopausal on angeliq. Began taking on 08/24/2013 BID for 6 days bleeding stopped then dropped to once daily for one week. Patient was given rx for angeliq 1 yr to take to [place]. Patient is staying in [place] and needs rx to local pharmacy. Okay to sent at this time?

## 2013-09-15 NOTE — Telephone Encounter (Signed)
Patient is calling asking for another month refill for the angeliq for here she doesn't want to send off her mail order until she gets back to [place] she is saying longer in the [place] than planned.  Patient also is wanting to let us know she finished the provera and stop bleeding on 08/28/13 and stopped the medication on 09/11/13 and started bleeding again on 09/14/13 and it is heavier now than it was before. Also having cramps

## 2013-09-18 NOTE — Telephone Encounter (Signed)
Patient is calling to give an update. Says from bleeding got heavier Saturday and Sunday but now seems to be decreasing.

## 2013-09-18 NOTE — Telephone Encounter (Signed)
Spoke with patient. She states that bleeding over the weekend was heavier and now she states bleeding is better. Changing pad 3 times per day, which is decreased from our last conversation. She will call with any increase in bleeding and knows that Dr. Sabra Heck did expect her to have bleeding. She will call our office if bleeding worsens and understands to call back or seek immediate medical care if soaking through more than one pad per hour for two hours.   Routing to Zarif Rathje for final review. Patient agreeable to disposition. Will close encounter

## 2013-12-29 ENCOUNTER — Ambulatory Visit: Payer: 59 | Admitting: Hematology and Oncology

## 2014-01-05 ENCOUNTER — Other Ambulatory Visit: Payer: Self-pay

## 2014-01-22 ENCOUNTER — Encounter: Payer: Self-pay | Admitting: Certified Nurse Midwife

## 2014-08-27 ENCOUNTER — Ambulatory Visit: Payer: 59 | Admitting: Obstetrics & Gynecology

## 2014-09-07 ENCOUNTER — Telehealth: Payer: Self-pay | Admitting: Hematology and Oncology

## 2014-09-07 NOTE — Telephone Encounter (Signed)
pt called to sched 74yr f/u....done....pt ok and aware of new d.t

## 2014-09-25 ENCOUNTER — Ambulatory Visit (INDEPENDENT_AMBULATORY_CARE_PROVIDER_SITE_OTHER): Payer: Managed Care, Other (non HMO) | Admitting: Certified Nurse Midwife

## 2014-09-25 ENCOUNTER — Encounter: Payer: Self-pay | Admitting: Certified Nurse Midwife

## 2014-09-25 VITALS — BP 118/80 | HR 74 | Resp 14 | Ht 64.5 in | Wt 147.2 lb

## 2014-09-25 DIAGNOSIS — Z124 Encounter for screening for malignant neoplasm of cervix: Secondary | ICD-10-CM

## 2014-09-25 DIAGNOSIS — R319 Hematuria, unspecified: Secondary | ICD-10-CM

## 2014-09-25 DIAGNOSIS — Z01419 Encounter for gynecological examination (general) (routine) without abnormal findings: Secondary | ICD-10-CM | POA: Diagnosis not present

## 2014-09-25 DIAGNOSIS — Z0000 Encounter for general adult medical examination without abnormal findings: Secondary | ICD-10-CM | POA: Diagnosis not present

## 2014-09-25 LAB — POCT URINALYSIS DIPSTICK
Leukocytes, UA: NEGATIVE
PH UA: 5
Urobilinogen, UA: NEGATIVE

## 2014-09-25 NOTE — Progress Notes (Signed)
59 y.o. [id number] Married Caucasian Fe here for annual exam. Menopausal on HRT. HRT working well and aware she will need to start weaning off in the next year as discussed. Plans to do this in 1/17 when less issues if possible. Denies vaginal bleeding and occasional vaginal dryness. 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 Spouse having hip replacement while she is here. Patient plans to have BMD with mammogram which is due now and have done prior to leaving the country again. Feels memory has not changed and working on activities to improve. Declines referral to Neurology.Denies urinary frequency,urgeny or pain. No vaginal symptoms other than dryness. Has appointment with GI tomorrow to schedule colonoscopy again and have labs.. IBS issues still occurring. Son finally out of school! No other health issues today.  Patient's last menstrual period was 08/08/2013.          Sexually active: Yes.    The current method of family planning is post menopausal status.    Exercising: Yes.    walking 3x/wk Smoker:  no  Health Maintenance: Pap:  09/05/13 wnl MMG and Left Breast Ultrasound: 08/10/13  bi-rads 1: neg SBE: not monthly  Colonoscopy:  2006, will schedule in July BMD:  Never had one TDaP:  10/21/11  Labs: Hgb: PCP ; Urine: Trace RBC's   reports that she has never smoked. She has never used smokeless tobacco. She reports that she drinks about 6.0 oz of alcohol per week. She reports that she does not use illicit drugs.  Past Medical History  Diagnosis Date  . Skin cancer 09/30/2004    malignant melanoma.  . Hypertension     previous hx  . Migraine   . Simple endometrial hyperplasia without atypia 08/18/2013  . Rectal leakage   . IBS (irritable bowel syndrome)     Past Surgical History  Procedure Laterality Date  . Cholecystectomy  1980  . Wrist surgery      rt wrist  . Shoulder arthroscopy      left 2011/right 2012  . Other surgical history      full buttock revision   . Melanoma excision  2006  . Endometrial biopsy  2010    negative atypia/hyperplasia  . Cervical cerclage      Current Outpatient Prescriptions  Medication Sig Dispense Refill  . ALPRAZolam (XANAX) 0.25 MG tablet Take 0.25 mg by mouth as needed.    Marland Kitchen BEPREVE 1.5 % SOLN 2 (two) times daily.    Marland Kitchen dexlansoprazole (DEXILANT) 60 MG capsule Take 60 mg by mouth daily.    . Drospirenone-Estradiol (ANGELIQ) 0.25-0.5 MG TABS Take 1 tablet by mouth daily. 30 tablet 0  . DYMISTA 137-50 MCG/ACT SUSP 2 (two) times daily.    . hyoscyamine (LEVSIN, ANASPAZ) 0.125 MG tablet Take 0.125 mg by mouth as needed.    Marland Kitchen ketoconazole (NIZORAL) 2 % cream 2 (two) times daily.    . montelukast (SINGULAIR) 10 MG tablet daily.    . simvastatin (ZOCOR) 40 MG tablet Take 40 mg by mouth every evening.    . zolpidem (AMBIEN) 10 MG tablet Take 5 mg by mouth at bedtime as needed for sleep.     No current facility-administered medications for this visit.    Family History  Problem Relation Age of Onset  . Breast cancer Mother   . Cancer Mother     bladder cancer,  precancerous moles.  . Hypertension Mother   . Heart disease Mother   . Cancer Sister  has atypical moles.  . Diabetes Sister   . Scleroderma Sister   . Cancer Father     bladder  . Hypertension Father   . Alcohol abuse Brother     ROS:  Pertinent items are noted in HPI.  Otherwise, a comprehensive ROS was negative.  Exam:   LMP 08/08/2013   Ht Readings from Last 3 Encounters:  09/05/13 5' 4.25" (1.632 m)  08/29/13 5' 4.5" (1.638 m)  08/24/13 5' 4.5" (1.638 m)    General appearance: alert, cooperative and appears stated age Head: Normocephalic, without obvious abnormality, atraumatic Neck: no adenopathy, supple, symmetrical, trachea midline and thyroid normal to inspection and palpation Lungs: clear to auscultation bilaterally Breasts: normal appearance, no masses or tenderness, No nipple retraction or dimpling, No nipple discharge or  bleeding, No axillary or supraclavicular adenopathy Heart: regular rate and rhythm Abdomen: soft, non-tender; no masses,  no organomegaly Extremities: extremities normal, atraumatic, no cyanosis or edema Skin: Skin color, texture, turgor normal. No rashes or lesions Lymph nodes: Cervical, supraclavicular, and axillary nodes normal. No abnormal inguinal nodes palpated Neurologic: Grossly normal   Pelvic: External genitalia:  no lesions              Urethra:  normal appearing urethra with no masses, tenderness or lesions              Bartholin's and Skene's: normal                 Vagina: normal appearing vagina with normal color and scant moisture noted and slight thinning at introitus, no lesions              Cervix: normal,non tender,no lesions              Pap taken: Yes.   Bimanual Exam:  Uterus:  normal size, contour, position, consistency, mobility, non-tender and mid position              Adnexa: normal adnexa and no mass, fullness, tenderness               Rectovaginal: Confirms               Anus:  normal sphincter tone, no lesions  Chaperone present: Yes  A:  Well Woman with normal exam  Menopausal on HRT working well planning to wean off in next year unless [employer] health issues occur, then will stop sooner. Aware she needs mammogram for refill on HRT, she has enough for another month and will advise when mammogram done.  Hematuria asymptomatic R/O UTI probably due to vaginal dryness  Vaginal dryness  P:   Reviewed health and wellness pertinent to exam  Aware of need to evaluate if vaginal bleeding again.  Mammogram/BMD due(will schedule soon) and colonoscopy due(scheduled)  Warning signs of UTI given. Increase water and decrease soda and tea.  Lab Urine micro/culture  Discussed vaginal dryness finding. Discussed options for treatment with estrogen or OTC. Patient plans to try coconut oil 3 times weekly and for sexual activity. Will advise if no change prior to leaving  country if needs other treatment.  Pap smear taken with HPV reflex   counseled on breast self exam, mammography screening, use and side effects of HRT, adequate intake of calcium and vitamin D, diet and exercise, Kegel's exercises  return annually or prn  An After Visit Summary was printed and given to the patient.

## 2014-09-25 NOTE — Patient Instructions (Signed)

## 2014-09-26 LAB — URINALYSIS, MICROSCOPIC ONLY
Bacteria, UA: NONE SEEN
Casts: NONE SEEN
Crystals: NONE SEEN
Squamous Epithelial / LPF: NONE SEEN

## 2014-09-27 LAB — URINE CULTURE
COLONY COUNT: NO GROWTH
ORGANISM ID, BACTERIA: NO GROWTH

## 2014-09-27 LAB — IPS PAP TEST WITH HPV

## 2014-09-27 NOTE — Progress Notes (Signed)
Reviewed personally.  M. Suzanne Kayron Kalmar, MD.  

## 2014-10-08 ENCOUNTER — Encounter: Payer: Self-pay | Admitting: Genetic Counselor

## 2014-10-11 ENCOUNTER — Telehealth: Payer: Self-pay | Admitting: Certified Nurse Midwife

## 2014-10-11 ENCOUNTER — Ambulatory Visit: Payer: 59 | Attending: Family Medicine

## 2014-10-11 ENCOUNTER — Other Ambulatory Visit: Payer: Self-pay

## 2014-10-11 DIAGNOSIS — Z780 Asymptomatic menopausal state: Secondary | ICD-10-CM

## 2014-10-11 DIAGNOSIS — M5432 Sciatica, left side: Secondary | ICD-10-CM | POA: Diagnosis not present

## 2014-10-11 DIAGNOSIS — R29898 Other symptoms and signs involving the musculoskeletal system: Secondary | ICD-10-CM | POA: Diagnosis present

## 2014-10-11 DIAGNOSIS — M25652 Stiffness of left hip, not elsewhere classified: Secondary | ICD-10-CM | POA: Insufficient documentation

## 2014-10-11 DIAGNOSIS — E2839 Other primary ovarian failure: Secondary | ICD-10-CM

## 2014-10-11 DIAGNOSIS — Z1231 Encounter for screening mammogram for malignant neoplasm of breast: Secondary | ICD-10-CM

## 2014-10-11 NOTE — Patient Instructions (Addendum)
HIP: Hamstrings - Short Sitting   Rest leg on raised surface. Keep knee straight. Lift chest. Hold _20__ seconds. Do 2x each leg, do 5x a day.   Piriformis Stretch, Sitting   Sit, one ankle on opposite knee, same-side hand on crossed knee. Push down on knee, keeping spine straight. Lean torso forward, with flat back, until tension is felt inand gluteals of crossed-leg side. Hold __20_ seconds.  Repeat __2x_ times per session. Do __3x_ sessions per day.  [facility] [address] [place], [place] [address] Phone # [phone] Fax [phone]

## 2014-10-11 NOTE — Telephone Encounter (Signed)
Patient calling stating [facility] told her we need to send orders for a mammogram and a bone density mammogram prior to her scheduling.   Patient also has some questions about whether she needs a 3-D mammogram or not.

## 2014-10-11 NOTE — Therapy (Signed)
[facility] [address], [place], [place], [address] Phone: [phone]   Fax:  [phone]  Physical Therapy Evaluation  Patient Details  Name: Kristina Delgado MRN: [id number] Date of Birth: [birth year] Referring Provider:  Marda Stalker, PA-C  Encounter Date: 10/11/2014      PT End of Session - 10/11/14 1309    Visit Number 1   Date for PT Re-Evaluation 12/06/14   PT Start Time 1020   PT Stop Time 1105   PT Time Calculation (min) 45 min   Activity Tolerance Patient tolerated treatment well   Behavior During Therapy [facility] for tasks assessed/performed      Past Medical History  Diagnosis Date  . Skin cancer 09/30/2004    malignant melanoma.  . Hypertension     previous hx  . Migraine   . Simple endometrial hyperplasia without atypia 08/18/2013  . Rectal leakage   . IBS (irritable bowel syndrome)     Past Surgical History  Procedure Laterality Date  . Cholecystectomy  1980  . Wrist surgery      rt wrist  . Shoulder arthroscopy      left 2011/right 2012  . Other surgical history      full buttock revision  . Melanoma excision  2006  . Endometrial biopsy  2010    negative atypia/hyperplasia  . Cervical cerclage      There were no vitals filed for this visit.  Visit Diagnosis:  Sciatica associated with disorder of lumbar spine, left - Plan: PT plan of care cert/re-cert  Hip stiffness, left - Plan: PT plan of care cert/re-cert  Weakness of right hip - Plan: PT plan of care cert/re-cert      Subjective Assessment - 10/11/14 1028    Subjective Has noticed increased Lt. leg pain with sitting at low chairs and on the train causes pain down the lower leg and to the front of the shin. Finds that sitting on knuckles on her bottom seems to help. Has also been experiencing pain in Rt. knee that is intermittent that occurs going up and down steps, believes it may be due to osteoarthritis.      How  long can you sit comfortably? Intermittent, can be 5 min to an hour sometimes, feels like she constantly needs to change position   How long can you stand comfortably? Not limited    How long can you walk comfortably? Not limited    Diagnostic tests None    Patient Stated Goals Get an exercise program to help relieve pain, and decrease recurreneces.    Currently in Pain? Yes   Pain Score 1    Pain Location Leg   Pain Orientation Left   Pain Descriptors / Indicators Burning   Pain Type Chronic pain   Pain Radiating Towards Goes from hip down to anterior leg    Pain Onset More than a month ago   Pain Frequency Intermittent   Aggravating Factors  Sitting    Pain Relieving Factors Walking, Tylenol, lying down flat, ice, taking the weight off    Multiple Pain Sites No            [facility] PT Assessment - 10/11/14 0001    Assessment   Medical Diagnosis M54.30 - Sciatica, M25.569 - Knee pain   Onset Date/Surgical Date 10/11/11   Next MD Visit Next year for physical    Prior Therapy PT for post surgery of shoulder, wrist    Precautions  Precautions Other (comment)  Hx of CA    Restrictions   Weight Bearing Restrictions No   Balance Screen   Has the patient fallen in the past 6 months No   Has the patient had a decrease in activity level because of a fear of falling?  No   Is the patient reluctant to leave their home because of a fear of falling?  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    Leisure Travel, eat    Cognition   Overall Cognitive Status Within Functional Limits for tasks assessed   Observation/Other Assessments   Focus on Therapeutic Outcomes (FOTO)  40%   ROM / Strength   AROM / PROM / Strength AROM;PROM;Strength   AROM   Overall AROM  Within functional limits for tasks  performed   Overall AROM Comments Lumbar and LE motion WNL    PROM   Overall PROM  Deficits   Overall PROM Comments Lt SLR was 25% limited compared to Rt; bil hip IR rotation limited 50%     Strength   Overall Strength Deficits   Strength Assessment Site Hip;Knee;Ankle   Right/Left Hip Right;Left   Right Hip Flexion 3/5   Left Hip Flexion 4+/5   Right/Left Knee Right;Left   Right Knee Flexion 5/5   Right Knee Extension 4/5   Left Knee Flexion 5/5   Left Knee Extension 5/5   Right/Left Ankle Right;Left   Right Ankle Dorsiflexion 5/5   Left Ankle Dorsiflexion 5/5   Flexibility   Soft Tissue Assessment /Muscle Length yes   Palpation   Spinal mobility Increased stiffness and tenderness with PA spinal mobilization at L2-L5; increased tenderness at Lt. piriformis origin    Special Tests    Special Tests Lumbar   Lumbar Tests Slump Test  (+) Slump on Lt. side.                            PT Education - 10/11/14 1319    Education provided Yes   Education Details HEP: hamstring, piriformis stretch    Person(s) Educated Patient   Methods Explanation;Demonstration;Handout   Comprehension Verbalized understanding;Returned demonstration          PT Short Term Goals - 10/11/14 1324    PT SHORT TERM GOAL #1   Title Independent with initial HEP    Time 4   Period Weeks   Status New   PT SHORT TERM GOAL #2   Title Report 30% decrease in low back pain with sitting activitites    Time 4   Period Weeks   Status New   PT SHORT TERM GOAL #3   Title report a 30% reduction in knee pain with negotiating steps   Time 4   Period Weeks   Status New           PT Long Term Goals - 10/11/14 1324    PT LONG TERM GOAL #1   Title Be independent with advanced HEP    Time 8   Period Weeks   Status New   PT LONG TERM GOAL #2   Title Report 50% decrease in low back pain sitting in the car    Time 8   Period Weeks   Status New  PT LONG TERM GOAL #3   Title  Report a FOTO limitation of < or = 35%    Time 8   Period Weeks   Status New   PT LONG TERM GOAL #4   Title Increase hip flexion strength to 4+/5 on the Rt.    Time 8   Period Weeks   Status New   PT LONG TERM GOAL #5   Title Verbalize and demonstrate understanding of stretches and exericses that will alleviate Lt leg pain    Time 8   Period Weeks   Status New               Plan - 10/11/14 1309    Clinical Impression Statement 59 y.o female presents with burning Lt. 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 She has also noticed increased pain in her Rt. knee with negotiating stairs and certain motions. (+) Slump test, decreased IR  motion and reproduction of symptoms of sciatic pain with L4-L5 palpation/piriformis tightness suggest sciatic nerve invovlement. Decreased strength with Rt. knee and OA may be contributing to increased Rt. knee pain. Pt will benefit from flexiblity, postural strengthening, core stabilization and LE strengthening to address deficits.      Pt will benefit from skilled therapeutic intervention in order to improve on the following deficits Pain;Increased muscle spasms;Decreased strength;Decreased activity tolerance;Impaired flexibility   Rehab Potential Good   PT Frequency 2x / week   PT Duration 8 weeks   PT Treatment/Interventions Cryotherapy;Moist Heat;Stair training;Functional mobility training;Therapeutic activities;Therapeutic exercise;Gait training;Neuromuscular re-education;Patient/family education;Energy conservation;Traction;Ultrasound;Electrical Stimulation;ADLs/Self Care Home Management;Passive range of motion;Manual techniques   PT Next Visit Plan Begin with bike, review HEP/stretching, try lumbar extension exercises, nerve glide exercises, leg press, analyze gait    Consulted and Agree with Plan of Care Patient         Problem List Patient Active Problem List   Diagnosis Date Noted  .  Unspecified vitamin D deficiency 08/29/2013  . Simple endometrial hyperplasia without atypia 2010 08/18/2013  . Melanoma 11/03/2011    Reginal Lutes, SPT 10/11/2014 2:16 PM   During this treatment session, the therapist was present, participating in, and directing the treatment. TAKACS,KELLY, PT 10/11/2014, 2:16 PM  [facility] [address] [place], [place], [address] Phone: [phone]   Fax:  [phone]

## 2014-10-11 NOTE — Telephone Encounter (Signed)
Spoke with patient. Patient states that [facility] that [facility] need [facility] for a screening mammogram and BMD. Patient denies any current problems with her breasts. Order placed for BMD advised order for screening mammogram is not needed. Patient states that she would like to have 3-D mammogram. Advised to request this at time of scheduling. Patient is agreeable and verbalizes understanding.  Routing to provider for final review. Patient agreeable to disposition. Will close encounter.   Patient aware provider will review message and nurse will return call if any additional advice or change of disposition.

## 2014-10-16 ENCOUNTER — Ambulatory Visit: Payer: 59

## 2014-10-16 DIAGNOSIS — M5432 Sciatica, left side: Secondary | ICD-10-CM | POA: Diagnosis not present

## 2014-10-16 DIAGNOSIS — M25652 Stiffness of left hip, not elsewhere classified: Secondary | ICD-10-CM

## 2014-10-16 DIAGNOSIS — R29898 Other symptoms and signs involving the musculoskeletal system: Secondary | ICD-10-CM

## 2014-10-16 NOTE — Therapy (Signed)
[facility] [address], [place], [place], [address] Phone: [phone]   Fax:  [phone]  Physical Therapy Treatment  Patient Details  Name: Kristina Delgado MRN: [id number] Date of Birth: 05/03/1955 Referring Provider:  Marda Stalker, PA-C  Encounter Date: 10/16/2014      PT End of Session - 10/16/14 0802    Visit Number 2   Date for PT Re-Evaluation 12/06/14   PT Start Time 0731   PT Stop Time 0805   PT Time Calculation (min) 34 min   Activity Tolerance Patient tolerated treatment well   Behavior During Therapy [facility] for tasks assessed/performed      Past Medical History  Diagnosis Date  . Skin cancer 09/30/2004    malignant melanoma.  . Hypertension     previous hx  . Migraine   . Simple endometrial hyperplasia without atypia 08/18/2013  . Rectal leakage   . IBS (irritable bowel syndrome)     Past Surgical History  Procedure Laterality Date  . Cholecystectomy  1980  . Wrist surgery      rt wrist  . Shoulder arthroscopy      left 2011/right 2012  . Other surgical history      full buttock revision  . Melanoma excision  2006  . Endometrial biopsy  2010    negative atypia/hyperplasia  . Cervical cerclage      There were no vitals filed for this visit.  Visit Diagnosis:  Sciatica associated with disorder of lumbar spine, left  Hip stiffness, left  Weakness of right hip      Subjective Assessment - 10/16/14 0734    Subjective Doing well with exercise.     Currently in Pain? Yes   Pain Score 2   2-5/10 when it hurts   Pain Location Leg   Pain Orientation Left   Pain Descriptors / Indicators Burning   Pain Type Chronic pain   Pain Onset More than a month ago   Pain Frequency Intermittent   Aggravating Factors  sitting in low seat   Pain Relieving Factors walking, Tylenol, ice                         [facility] Adult PT Treatment/Exercise - 10/16/14 0001    Exercises   Exercises Knee/Hip;Lumbar   Lumbar Exercises: Stretches   Active Hamstring Stretch 3 reps;20 seconds  seated and supine   Piriformis Stretch 3 reps;20 seconds  seated and supine   Lumbar Exercises: Aerobic   Stationary Bike Level 1 x 6 minutes  PT present to discuss progress   Lumbar Exercises: Supine   Straight Leg Raise 20 reps   Straight Leg Raises Limitations with abdominal bracing                PT Education - 10/16/14 0756    Education provided Yes   Education Details HEP: hamstring and piriformis stretch in supine, bridge and SLR   Person(s) Educated Patient   Methods Explanation;Demonstration;Handout   Comprehension Verbalized understanding          PT Short Term Goals - 10/16/14 0736    PT SHORT TERM GOAL #1   Title Independent with initial HEP    Time 4   Period Weeks   Status On-going  independent in current HEP   PT SHORT TERM GOAL #2   Title Report 30% decrease in low back pain with sitting activitites    Time 4  Period Weeks   Status On-going  no significant change due to only 1 visit           PT Long Term Goals - 10/11/14 1324    PT LONG TERM GOAL #1   Title Be independent with advanced HEP    Time 8   Period Weeks   Status New   PT LONG TERM GOAL #2   Title Report 50% decrease in low back pain sitting in the car    Time 8   Period Weeks   Status New   PT LONG TERM GOAL #3   Title Report a FOTO limitation of < or = 35%    Time 8   Period Weeks   Status New   PT LONG TERM GOAL #4   Title Increase hip flexion strength to 4+/5 on the Rt.    Time 8   Period Weeks   Status New   PT LONG TERM GOAL #5   Title Verbalize and demonstrate understanding of stretches and exericses that will alleviate Lt leg pain    Time 8   Period Weeks   Status New               Plan - 10/16/14 0741    Clinical Impression Statement Pt with only 1 session after evaluation and is independent in initial HEP.  Pt with continued  knee pain and LE radiculopathy.  Pt will benefit from flexibility progression and core/knee strength.     Pt will benefit from skilled therapeutic intervention in order to improve on the following deficits Pain;Increased muscle spasms;Decreased strength;Decreased activity tolerance;Impaired flexibility   Rehab Potential Good   PT Frequency 2x / week   PT Duration 6 weeks   PT Treatment/Interventions Cryotherapy;Moist Heat;Stair training;Functional mobility training;Therapeutic activities;Therapeutic exercise;Gait training;Neuromuscular re-education;Patient/family education;Energy conservation;Traction;Ultrasound;Electrical Stimulation;ADLs/Self Care Home Management;Passive range of motion;Manual techniques   PT Next Visit Plan Continue hip and knee strength, lumbar extenson.  Reveiw new HEP   Consulted and Agree with Plan of Care Patient        Problem List Patient Active Problem List   Diagnosis Date Noted  . Unspecified vitamin D deficiency 08/29/2013  . Simple endometrial hyperplasia without atypia 2010 08/18/2013  . Melanoma 11/03/2011    TAKACS,KELLY, PT 10/16/2014, 8:05 AM  [facility] [address], [place], [place], [address] Phone: [phone]   Fax:  [phone]

## 2014-10-16 NOTE — Patient Instructions (Signed)
Straight Leg Raise   Squeeze pelvic floor and hold. Tighten top of left thigh. Raise leg off bed _12-18__ inches. Hold for _1-3__ seconds. Relax for ___ seconds. Repeat _2x10__ times. Do _2__ times a day. Repeat with other leg.  Supine: Leg Stretch With Strap (Basic)   Lie on back with one knee bent, foot flat on floor. Hook strap around other foot. Straighten knee. Keep knee level with other knee. Hold _20__ seconds. Relax leg completely down to floor.  Repeat _3__ times per session. Do _2__ sessions per day.  Piriformis (Supine)   Cross legs, right on top. Gently pull other knee toward chest until stretch is felt in buttock/hip of top leg. Hold _20___ seconds. Repeat _3___ times per set. Do _1___ sets per session. Do __2__ sessions per day.  Bridge   Lie back, legs bent. Inhale, pressing hips up. Keeping ribs in, lengthen lower back. Exhale, rolling down along spine from top.  Hold 5 seconds Repeat _2x10___ times. Do __2__ sessions per day.  Copyright  VHI. All rights reserved.   [facility] [address] [place], [place] [address] Phone # [phone] Fax [phone]

## 2014-10-18 ENCOUNTER — Encounter: Payer: Self-pay | Admitting: Physical Therapy

## 2014-10-18 ENCOUNTER — Ambulatory Visit
Admission: RE | Admit: 2014-10-18 | Discharge: 2014-10-18 | Disposition: A | Discharge status: Home / Self Care | Payer: 59 | Source: Ambulatory Visit

## 2014-10-18 ENCOUNTER — Ambulatory Visit: Payer: 59 | Admitting: Physical Therapy

## 2014-10-18 ENCOUNTER — Ambulatory Visit
Admission: RE | Admit: 2014-10-18 | Discharge: 2014-10-18 | Disposition: A | Discharge status: Home / Self Care | Payer: 59 | Source: Ambulatory Visit | Attending: Certified Nurse Midwife | Admitting: Certified Nurse Midwife

## 2014-10-18 DIAGNOSIS — M25652 Stiffness of left hip, not elsewhere classified: Secondary | ICD-10-CM

## 2014-10-18 DIAGNOSIS — M5432 Sciatica, left side: Secondary | ICD-10-CM | POA: Diagnosis not present

## 2014-10-18 DIAGNOSIS — Z1231 Encounter for screening mammogram for malignant neoplasm of breast: Secondary | ICD-10-CM

## 2014-10-18 DIAGNOSIS — E2839 Other primary ovarian failure: Secondary | ICD-10-CM

## 2014-10-18 DIAGNOSIS — Z780 Asymptomatic menopausal state: Secondary | ICD-10-CM

## 2014-10-18 DIAGNOSIS — R29898 Other symptoms and signs involving the musculoskeletal system: Secondary | ICD-10-CM

## 2014-10-18 NOTE — Therapy (Signed)
[facility] [address], [place], [place], [address] Phone: [phone]   Fax:  [phone]  Physical Therapy Treatment  Patient Details  Name: Kristina Delgado MRN: [id number] Date of Birth: 1955/06/01 Referring Provider:  Marda Stalker, PA-C  Encounter Date: 10/18/2014      PT End of Session - 10/18/14 0854    Visit Number 3   Date for PT Re-Evaluation 12/06/14   PT Start Time 0845   PT Stop Time 0930   PT Time Calculation (min) 45 min   Activity Tolerance Patient tolerated treatment well   Behavior During Therapy [facility] for tasks assessed/performed      Past Medical History  Diagnosis Date  . Skin cancer 09/30/2004    malignant melanoma.  . Hypertension     previous hx  . Migraine   . Simple endometrial hyperplasia without atypia 08/18/2013  . Rectal leakage   . IBS (irritable bowel syndrome)     Past Surgical History  Procedure Laterality Date  . Cholecystectomy  1980  . Wrist surgery      rt wrist  . Shoulder arthroscopy      left 2011/right 2012  . Other surgical history      full buttock revision  . Melanoma excision  2006  . Endometrial biopsy  2010    negative atypia/hyperplasia  . Cervical cerclage      There were no vitals filed for this visit.  Visit Diagnosis:  Sciatica associated with disorder of lumbar spine, left  Hip stiffness, left  Weakness of right hip      Subjective Assessment - 10/18/14 0852    Subjective Since last treatment I have had increased pain.    Limitations Sitting   How long can you sit comfortably? Intermittent, can be 5 min to an hour sometimes, feels like she constantly needs to change position   Patient Stated Goals Get an exercise program to help relieve pain, and decrease recurreneces.    Currently in Pain? Yes   Pain Score 6    Pain Location Leg   Pain Orientation Left   Pain Descriptors / Indicators Burning   Pain Type Chronic pain    Pain Radiating Towards goes down into left knee   Pain Onset More than a month ago   Pain Frequency Intermittent   Aggravating Factors  sitting in low seat   Pain Relieving Factors walking, tylenol ,ice   Multiple Pain Sites No            [facility] PT Assessment - 10/18/14 0001    Palpation   SI assessment  right ilium is anteriorly rotated, sacrum rotated left   Special Tests    Special Tests Sacrolliac Tests   Sacroiliac Tests  Gaenslen's Test   Pelvic Dictraction   Findings Positive   Side  Right   Comment pain   Gaenslen's test   Findings Positive   Side  Left   Comments uncomfortable                     [facility] Adult PT Treatment/Exercise - 10/18/14 0001    Lumbar Exercises: Stretches   Piriformis Stretch 3 reps;20 seconds  seated and supine   Manual Therapy   Manual Therapy Soft tissue mobilization;Joint mobilization;Muscle Energy Technique   Joint Mobilization rotational mobilization to L1-L5; sacral mobilization to correct left rotation;    Soft tissue mobilization to left iliotibial band, left gluteal, piriformis, SI joint   Muscle  Energy Technique to correct left ilium for posteriorly rotation                 PT Education - 10/18/14 0923    Education provided Yes   Education Details reviewed HEP and patient is independent    Person(s) Educated Patient   Methods Demonstration;Explanation   Comprehension Verbalized understanding;Returned demonstration          PT Short Term Goals - 10/16/14 0736    PT SHORT TERM GOAL #1   Title Independent with initial HEP    Time 4   Period Weeks   Status On-going  independent in current HEP   PT SHORT TERM GOAL #2   Title Report 30% decrease in low back pain with sitting activitites    Time 4   Period Weeks   Status On-going  no significant change due to only 1 visit           PT Long Term Goals - 10/11/14 1324    PT LONG TERM GOAL #1   Title Be independent with advanced HEP    Time 8    Period Weeks   Status New   PT LONG TERM GOAL #2   Title Report 50% decrease in low back pain sitting in the car    Time 8   Period Weeks   Status New   PT LONG TERM GOAL #3   Title Report a FOTO limitation of < or = 35%    Time 8   Period Weeks   Status New   PT LONG TERM GOAL #4   Title Increase hip flexion strength to 4+/5 on the Rt.    Time 8   Period Weeks   Status New   PT LONG TERM GOAL #5   Title Verbalize and demonstrate understanding of stretches and exericses that will alleviate Lt leg pain    Time 8   Period Weeks   Status New               Plan - 10/18/14 8101    Clinical Impression Statement Patient left ilium was posteriorly rotated and sacrum rotated left.  After therapy pelvis in correct alignment, able to sit equally on bil. buttocks and less tension in left lower extremity. Patient is independent with current HEP.  Patient will bnefit from physical therapy to reduce pain , correct pelvis and improve strength.    Pt will benefit from skilled therapeutic intervention in order to improve on the following deficits Pain;Increased muscle spasms;Decreased strength;Decreased activity tolerance;Impaired flexibility   Rehab Potential Good   Clinical Impairments Affecting Rehab Potential None   PT Frequency 2x / week   PT Duration 6 weeks   PT Treatment/Interventions Cryotherapy;Moist Heat;Stair training;Functional mobility training;Therapeutic activities;Therapeutic exercise;Gait training;Neuromuscular re-education;Patient/family education;Energy conservation;Traction;Ultrasound;Electrical Stimulation;ADLs/Self Care Home Management;Passive range of motion;Manual techniques   PT Next Visit Plan Continue hip and knee strength, lumbar extenson.  correct pelvis, neural tension  stretch on left   PT Home Exercise Plan progress as needed   Recommended Other Services None   Consulted and Agree with Plan of Care Patient        Problem List Patient Active Problem  List   Diagnosis Date Noted  . Unspecified vitamin D deficiency 08/29/2013  . Simple endometrial hyperplasia without atypia 2010 08/18/2013  . Melanoma 11/03/2011    Gianne Shugars,PT 10/18/2014, 9:28 AM  [facility] [address] [place], [place], [address] Phone: [phone]   Fax:  [phone]      

## 2014-10-22 ENCOUNTER — Ambulatory Visit: Payer: 59 | Attending: Family Medicine

## 2014-10-22 ENCOUNTER — Other Ambulatory Visit: Payer: Self-pay | Admitting: Hematology and Oncology

## 2014-10-22 DIAGNOSIS — R29898 Other symptoms and signs involving the musculoskeletal system: Secondary | ICD-10-CM | POA: Diagnosis present

## 2014-10-22 DIAGNOSIS — M25652 Stiffness of left hip, not elsewhere classified: Secondary | ICD-10-CM | POA: Diagnosis present

## 2014-10-22 DIAGNOSIS — M5432 Sciatica, left side: Secondary | ICD-10-CM | POA: Diagnosis not present

## 2014-10-22 DIAGNOSIS — C439 Malignant melanoma of skin, unspecified: Secondary | ICD-10-CM

## 2014-10-22 NOTE — Therapy (Signed)
[facility] [address], [place], [place], [address] Phone: [phone]   Fax:  [phone]  Physical Therapy Treatment  Patient Details  Name: Kristina Delgado MRN: [id number] Date of Birth: [birth year] Referring Provider:  Marda Stalker, PA-C  Encounter Date: 10/22/2014      PT End of Session - 10/22/14 1607    Visit Number 4   Date for PT Re-Evaluation 12/06/14   PT Start Time 1532   PT Stop Time 1607   PT Time Calculation (min) 35 min   Activity Tolerance Patient tolerated treatment well   Behavior During Therapy [facility] for tasks assessed/performed      Past Medical History  Diagnosis Date  . Skin cancer 09/30/2004    malignant melanoma.  . Hypertension     previous hx  . Migraine   . Simple endometrial hyperplasia without atypia 08/18/2013  . Rectal leakage   . IBS (irritable bowel syndrome)     Past Surgical History  Procedure Laterality Date  . Cholecystectomy  1980  . Wrist surgery      rt wrist  . Shoulder arthroscopy      left 2011/right 2012  . Other surgical history      full buttock revision  . Melanoma excision  2006  . Endometrial biopsy  2010    negative atypia/hyperplasia  . Cervical cerclage      There were no vitals filed for this visit.  Visit Diagnosis:  Sciatica associated with disorder of lumbar spine, left  Hip stiffness, left  Weakness of right hip      Subjective Assessment - 10/22/14 1534    Subjective Pain is still coming and going   Currently in Pain? Yes   Pain Score 2   4/10 on Saturday   Pain Location Leg   Pain Orientation Left   Pain Descriptors / Indicators Burning   Pain Type Chronic pain   Pain Onset More than a month ago   Aggravating Factors  sitting in low seat, driving car with low seat   Pain Relieving Factors walking, tylenol, ice                         [facility] Adult PT Treatment/Exercise - 10/22/14 0001    Lumbar  Exercises: Stretches   Active Hamstring Stretch 3 reps;20 seconds  seated and supine   Piriformis Stretch 3 reps;20 seconds  seated and supine   Lumbar Exercises: Supine   Bridge 20 reps   Straight Leg Raise 20 reps   Straight Leg Raises Limitations with abdominal bracing   Manual Therapy   Manual Therapy Joint mobilization;Muscle Energy Technique   Joint Mobilization rotational mobilization to L1-L5; sacral mobilization to correct left rotation;    Muscle Energy Technique to correct left ilium for posteriorly rotation                   PT Short Term Goals - 10/22/14 1542    PT SHORT TERM GOAL #1   Title Independent with initial HEP    Time 4   Period Weeks   Status Achieved   PT SHORT TERM GOAL #2   Title Report 30% decrease in low back pain with sitting activitites    Time 4   Period Weeks   Status On-going  not able to notice any change   PT SHORT TERM GOAL #3   Title report a 30% reduction in knee pain with negotiating  steps   Time 4   Period Weeks   Status On-going  pt has not had to go up/down steps           PT Long Term Goals - 10/11/14 1324    PT LONG TERM GOAL #1   Title Be independent with advanced HEP    Time 8   Period Weeks   Status New   PT LONG TERM GOAL #2   Title Report 50% decrease in low back pain sitting in the car    Time 8   Period Weeks   Status New   PT LONG TERM GOAL #3   Title Report a FOTO limitation of < or = 35%    Time 8   Period Weeks   Status New   PT LONG TERM GOAL #4   Title Increase hip flexion strength to 4+/5 on the Rt.    Time 8   Period Weeks   Status New   PT LONG TERM GOAL #5   Title Verbalize and demonstrate understanding of stretches and exericses that will alleviate Lt leg pain    Time 8   Period Weeks   Status New               Plan - 10/22/14 1602    Clinical Impression Statement Pt with improved alignment today in pelvis and sacrum.  Pt reports that she has not been negotiating steps  much to assess goal for steps.  Pt with increased stiffness with stretching on the Lt with exercises today.  Pt will benefit from continued PT for manual, strength and flexibility exercise.,   Pt will benefit from skilled therapeutic intervention in order to improve on the following deficits Pain;Increased muscle spasms;Decreased strength;Decreased activity tolerance;Impaired flexibility   Rehab Potential Good   PT Frequency 2x / week   PT Duration 6 weeks   PT Treatment/Interventions Cryotherapy;Moist Heat;Stair training;Functional mobility training;Therapeutic activities;Therapeutic exercise;Gait training;Neuromuscular re-education;Patient/family education;Energy conservation;Traction;Ultrasound;Electrical Stimulation;ADLs/Self Care Home Management;Passive range of motion;Manual techniques   PT Next Visit Plan Continue hip and knee strength, lumbar extenson.  correct pelvis if needed        Problem List Patient Active Problem List   Diagnosis Date Noted  . Unspecified vitamin D deficiency 08/29/2013  . Simple endometrial hyperplasia without atypia 2010 08/18/2013  . Melanoma 11/03/2011    Pope Brunty, PT 10/22/2014, 4:08 PM  [facility] [address], [place], [place], [address] Phone: [phone]   Fax:  [phone]

## 2014-10-23 ENCOUNTER — Encounter: Payer: Self-pay | Admitting: Hematology and Oncology

## 2014-10-23 ENCOUNTER — Other Ambulatory Visit (HOSPITAL_BASED_OUTPATIENT_CLINIC_OR_DEPARTMENT_OTHER): Payer: 59

## 2014-10-23 ENCOUNTER — Ambulatory Visit (HOSPITAL_BASED_OUTPATIENT_CLINIC_OR_DEPARTMENT_OTHER): Payer: 59 | Admitting: Hematology and Oncology

## 2014-10-23 VITALS — BP 145/80 | HR 87 | Temp 98.4°F | Resp 18 | Ht 64.5 in | Wt 148.8 lb

## 2014-10-23 DIAGNOSIS — C439 Malignant melanoma of skin, unspecified: Secondary | ICD-10-CM

## 2014-10-23 LAB — COMPREHENSIVE METABOLIC PANEL (CC13)
ALK PHOS: 22 U/L — AB (ref 40–150)
ALT: 18 U/L (ref 0–55)
AST: 20 U/L (ref 5–34)
Albumin: 4.1 g/dL (ref 3.5–5.0)
Anion Gap: 7 mEq/L (ref 3–11)
BUN: 12.9 mg/dL (ref 7.0–26.0)
CO2: 29 mEq/L (ref 22–29)
Calcium: 9.4 mg/dL (ref 8.4–10.4)
Chloride: 104 mEq/L (ref 98–109)
Creatinine: 0.8 mg/dL (ref 0.6–1.1)
EGFR: 83 mL/min/{1.73_m2} — ABNORMAL LOW (ref >90)
Glucose: 122 mg/dl (ref 70–140)
POTASSIUM: 4.1 meq/L (ref 3.5–5.1)
SODIUM: 140 meq/L (ref 136–145)
TOTAL PROTEIN: 6.8 g/dL (ref 6.4–8.3)
Total Bilirubin: 0.34 mg/dL (ref 0.20–1.20)

## 2014-10-23 LAB — CBC WITH DIFFERENTIAL/PLATELET
BASO%: 0.6 % (ref 0.0–2.0)
Basophils Absolute: 0 10*3/uL (ref 0.0–0.1)
EOS ABS: 0.2 10*3/uL (ref 0.0–0.5)
EOS%: 3.8 % (ref 0.0–7.0)
HCT: 39.4 % (ref 34.8–46.6)
HEMOGLOBIN: 13.1 g/dL (ref 11.6–15.9)
LYMPH%: 28.3 % (ref 14.0–49.7)
MCH: 29.7 pg (ref 25.1–34.0)
MCHC: 33.4 g/dL (ref 31.5–36.0)
MCV: 88.9 fL (ref 79.5–101.0)
MONO#: 0.4 10*3/uL (ref 0.1–0.9)
MONO%: 7.5 % (ref 0.0–14.0)
NEUT%: 59.8 % (ref 38.4–76.8)
NEUTROS ABS: 3.1 10*3/uL (ref 1.5–6.5)
Platelets: 267 10*3/uL (ref 145–400)
RBC: 4.43 10*6/uL (ref 3.70–5.45)
RDW: 12.8 % (ref 11.2–14.5)
WBC: 5.2 10*3/uL (ref 3.9–10.3)
lymph#: 1.5 10*3/uL (ref 0.9–3.3)

## 2014-10-23 LAB — LACTATE DEHYDROGENASE (CC13): LDH: 153 U/L (ref 125–245)

## 2014-10-23 NOTE — Assessment & Plan Note (Signed)
Clinically, she has no signs of disease recurrence. I continue to reinforce the importance of yearly examination by a dermatologist. Reinforced the importance of skin protection.

## 2014-10-23 NOTE — Progress Notes (Signed)
[facility] OFFICE PROGRESS NOTE  Patient Care Team: Marda Stalker, PA-C as PCP - General (Family Medicine)  SUMMARY OF ONCOLOGIC HISTORY:  This is a pleasant lady with excessive sun exposure in the past, had numerous skin lesions removed over the years. In 2006, she was noted to have an abnormal lesion on her upper back which came back melanoma. She underwent a wide local excision followed by sentinel lymph node biopsy which came back negative. Overall staging is T1 A. N0 M0 melanoma. The patient travels a lot but continue to see [place] on a yearly basis and her dermatologist regularly. She resides in [place] for most part of the year. She has been using skin protection and avoiding excessive sun exposure.  INTERVAL HISTORY: Please see below for problem oriented charting. She feels well. She developed some skin rash recently over her abdomen due to exposure of chemicals yesterday. She is taking Benadryl. Denies new skin moles. She saw her dermatologist last month and no abnormal skin lesions were removed.  REVIEW OF SYSTEMS:   Constitutional: Denies fevers, chills or abnormal weight loss Eyes: Denies blurriness of vision Ears, nose, mouth, throat, and face: Denies mucositis or sore throat Respiratory: Denies cough, dyspnea or wheezes Cardiovascular: Denies palpitation, chest discomfort or lower extremity swelling Gastrointestinal:  Denies nausea, heartburn or change in bowel habits Lymphatics: Denies new lymphadenopathy or easy bruising Neurological:Denies numbness, tingling or new weaknesses Behavioral/Psych: Mood is stable, no new changes  All other systems were reviewed with the patient and are negative.  I have reviewed the past medical history, past surgical history, social history and family history with the patient and they are unchanged from previous note.  ALLERGIES:  is allergic to latex; sulfa antibiotics; and tegaderm ag mesh.  MEDICATIONS:  Current  Outpatient Prescriptions  Medication Sig Dispense Refill  . ALPRAZolam (XANAX) 0.25 MG tablet Take 0.25 mg by mouth as needed.    Marland Kitchen BEPREVE 1.5 % SOLN 2 (two) times daily as needed.     . clindamycin (CLEOCIN T) 1 % lotion Apply topically 2 (two) times daily as needed.    Marland Kitchen dexlansoprazole (DEXILANT) 60 MG capsule Take 60 mg by mouth daily.    . Drospirenone-Estradiol (ANGELIQ) 0.25-0.5 MG TABS Take 1 tablet by mouth daily. 30 tablet 0  . DYMISTA 137-50 MCG/ACT SUSP 2 (two) times daily as needed.     . hyoscyamine (LEVSIN, ANASPAZ) 0.125 MG tablet Take 0.125 mg by mouth as needed.    Marland Kitchen ketoconazole (NIZORAL) 2 % cream 2 (two) times daily as needed.     . montelukast (SINGULAIR) 10 MG tablet daily as needed.     . simvastatin (ZOCOR) 40 MG tablet Take 40 mg by mouth every evening.    . zolpidem (AMBIEN) 10 MG tablet Take 5 mg by mouth at bedtime as needed for sleep.     No current facility-administered medications for this visit.    PHYSICAL EXAMINATION: ECOG PERFORMANCE STATUS: 0 - Asymptomatic  Filed Vitals:   10/23/14 1334  BP: 145/80  Pulse: 87  Temp: 98.4 F (36.9 C)  Resp: 18   Filed Weights   10/23/14 1334  Weight: 148 lb 12.8 oz (67.495 kg)    GENERAL:alert, no distress and comfortable SKIN: Numerous skin moles were noted but nothing suspicious for cancer. She has very trace mild rash over her abdomen EYES: normal, Conjunctiva are pink and non-injected, sclera clear OROPHARYNX:no exudate, no erythema and lips, buccal mucosa, and tongue normal  NECK: supple,  thyroid normal size, non-tender, without nodularity LYMPH:  no palpable lymphadenopathy in the cervical, axillary or inguinal LUNGS: clear to auscultation and percussion with normal breathing effort HEART: regular rate & rhythm and no murmurs and no lower extremity edema ABDOMEN:abdomen soft, non-tender and normal bowel sounds Musculoskeletal:no cyanosis of digits and no clubbing  NEURO: alert & oriented x 3 with  fluent speech, no focal motor/sensory deficits  LABORATORY DATA:  I have reviewed the data as listed    Component Value Date/Time   NA 140 10/23/2014 1315   NA 139 10/29/2011 0939   K 4.1 10/23/2014 1315   K 4.2 10/29/2011 0939   CL 102 10/29/2011 0939   CO2 29 10/23/2014 1315   CO2 28 10/29/2011 0939   GLUCOSE 122 10/23/2014 1315   GLUCOSE 66* 10/29/2011 0939   BUN 12.9 10/23/2014 1315   BUN 13 10/29/2011 0939   CREATININE 0.8 10/23/2014 1315   CREATININE 0.72 10/29/2011 0939   CALCIUM 9.4 10/23/2014 1315   CALCIUM 9.4 10/29/2011 0939   PROT 6.8 10/23/2014 1315   PROT 6.6 10/29/2011 0939   ALBUMIN 4.1 10/23/2014 1315   ALBUMIN 4.3 10/29/2011 0939   AST 20 10/23/2014 1315   AST 18 10/29/2011 0939   ALT 18 10/23/2014 1315   ALT 17 10/29/2011 0939   ALKPHOS 22* 10/23/2014 1315   ALKPHOS 19* 10/29/2011 0939   BILITOT 0.34 10/23/2014 1315   BILITOT 0.4 10/29/2011 0939   GFRNONAA >60 01/30/2009 1040   GFRAA  01/30/2009 1040    >60        The eGFR has been calculated using the MDRD equation. This calculation has not been validated in all clinical situations. eGFR's persistently <60 mL/min signify possible Chronic Kidney Disease.    No results found for: SPEP, UPEP  Lab Results  Component Value Date   WBC 5.2 10/23/2014   NEUTROABS 3.1 10/23/2014   HGB 13.1 10/23/2014   HCT 39.4 10/23/2014   MCV 88.9 10/23/2014   PLT 267 10/23/2014      Chemistry      Component Value Date/Time   NA 140 10/23/2014 1315   NA 139 10/29/2011 0939   K 4.1 10/23/2014 1315   K 4.2 10/29/2011 0939   CL 102 10/29/2011 0939   CO2 29 10/23/2014 1315   CO2 28 10/29/2011 0939   BUN 12.9 10/23/2014 1315   BUN 13 10/29/2011 0939   CREATININE 0.8 10/23/2014 1315   CREATININE 0.72 10/29/2011 0939      Component Value Date/Time   CALCIUM 9.4 10/23/2014 1315   CALCIUM 9.4 10/29/2011 0939   ALKPHOS 22* 10/23/2014 1315   ALKPHOS 19* 10/29/2011 0939   AST 20 10/23/2014 1315   AST 18  10/29/2011 0939   ALT 18 10/23/2014 1315   ALT 17 10/29/2011 0939   BILITOT 0.34 10/23/2014 1315   BILITOT 0.4 10/29/2011 0939      ASSESSMENT & PLAN:  Melanoma Clinically, she has no signs of disease recurrence. I continue to reinforce the importance of yearly examination by a dermatologist. Reinforced the importance of skin protection.   Orders Placed This Encounter  Procedures  . CBC with Differential/Platelet    Standing Status: Future     Number of Occurrences:      Standing Expiration Date: 11/27/2015  . Comprehensive metabolic panel    Standing Status: Future     Number of Occurrences:      Standing Expiration Date: 11/27/2015  . Lactate dehydrogenase    Standing  Status: Future     Number of Occurrences:      Standing Expiration Date: 11/27/2015   All questions were answered. The patient knows to call the clinic with any problems, questions or concerns. No barriers to learning was detected. I spent 15 minutes counseling the patient face to face. The total time spent in the appointment was 20 minutes and more than 50% was on counseling and review of test results     [facility], Lorelle Macaluso, MD 10/23/2014 4:17 PM

## 2014-10-24 ENCOUNTER — Ambulatory Visit: Payer: 59

## 2014-10-24 DIAGNOSIS — M5432 Sciatica, left side: Secondary | ICD-10-CM | POA: Diagnosis not present

## 2014-10-24 DIAGNOSIS — M25652 Stiffness of left hip, not elsewhere classified: Secondary | ICD-10-CM

## 2014-10-24 DIAGNOSIS — R29898 Other symptoms and signs involving the musculoskeletal system: Secondary | ICD-10-CM

## 2014-10-24 NOTE — Therapy (Signed)
[facility] [address] [place], [place], [address] Phone: [phone]   Fax:  [phone]  Physical Therapy Treatment  Patient Details  Name: Kristina Delgado MRN: [id number] Date of Birth: [birth year] Referring Provider:  Kathyrn Lass, MD  Encounter Date: 10/24/2014      PT End of Session - 10/24/14 0929    Visit Number 5   Date for PT Re-Evaluation 12/06/14   PT Start Time 0846   PT Stop Time 0928   PT Time Calculation (min) 42 min   Activity Tolerance Patient tolerated treatment well   Behavior During Therapy [facility] for tasks assessed/performed      Past Medical History  Diagnosis Date  . Skin cancer 09/30/2004    malignant melanoma.  . Hypertension     previous hx  . Migraine   . Simple endometrial hyperplasia without atypia 08/18/2013  . Rectal leakage   . IBS (irritable bowel syndrome)     Past Surgical History  Procedure Laterality Date  . Cholecystectomy  1980  . Wrist surgery      rt wrist  . Shoulder arthroscopy      left 2011/right 2012  . Other surgical history      full buttock revision  . Melanoma excision  2006  . Endometrial biopsy  2010    negative atypia/hyperplasia  . Cervical cerclage      There were no vitals filed for this visit.  Visit Diagnosis:  Sciatica associated with disorder of lumbar spine, left  Hip stiffness, left  Weakness of right hip      Subjective Assessment - 10/24/14 0850    Subjective No pain now.  Lt leg was "burning" yesterday   Currently in Pain? No/denies                         [facility] Adult PT Treatment/Exercise - 10/24/14 0001    Lumbar Exercises: Stretches   Active Hamstring Stretch 3 reps;20 seconds  seated and supine   Piriformis Stretch 3 reps;20 seconds  seated and supine   Lumbar Exercises: Aerobic   Stationary Bike Level 1 x 6 minutes  PT present to discuss progress   Lumbar Exercises: Supine   Bridge 20 reps    Straight Leg Raise 20 reps   Straight Leg Raises Limitations with abdominal bracing   Other Supine Lumbar Exercises nerve glide.  Performed seated and supine   Knee/Hip Exercises: Standing   Hip Abduction 2 sets;10 reps;Both   Hip Extension 2 sets;10 reps;Both   Manual Therapy   Manual Therapy Joint mobilization;Muscle Energy Technique   Joint Mobilization rotational mobilization to L1-L5; sacral mobilization to correct left rotation;    Muscle Energy Technique to correct left ilium for posteriorly rotation                 PT Education - 10/24/14 0924    Education provided Yes   Education Details sciatic nerve glide   Person(s) Educated Patient   Methods Explanation;Demonstration;Handout   Comprehension Verbalized understanding          PT Short Term Goals - 10/22/14 1542    PT SHORT TERM GOAL #1   Title Independent with initial HEP    Time 4   Period Weeks   Status Achieved   PT SHORT TERM GOAL #2   Title Report 30% decrease in low back pain with sitting activitites    Time 4   Period Weeks  Status On-going  not able to notice any change   PT SHORT TERM GOAL #3   Title report a 30% reduction in knee pain with negotiating steps   Time 4   Period Weeks   Status On-going  pt has not had to go up/down steps           PT Long Term Goals - 10/11/14 1324    PT LONG TERM GOAL #1   Title Be independent with advanced HEP    Time 8   Period Weeks   Status New   PT LONG TERM GOAL #2   Title Report 50% decrease in low back pain sitting in the car    Time 8   Period Weeks   Status New   PT LONG TERM GOAL #3   Title Report a FOTO limitation of < or = 35%    Time 8   Period Weeks   Status New   PT LONG TERM GOAL #4   Title Increase hip flexion strength to 4+/5 on the Rt.    Time 8   Period Weeks   Status New   PT LONG TERM GOAL #5   Title Verbalize and demonstrate understanding of stretches and exericses that will alleviate Lt leg pain    Time 8    Period Weeks   Status New               Plan - 10/24/14 0910    Clinical Impression Statement Pt tolerate standing exercise well today and reported more hip flexibility after doing these exercises.  Pt with continued Lt LE pain after sitting in low car seat.  Pt will benefit from stabilization, flexibility and manual to reduce pain and improve alignment.     Pt will benefit from skilled therapeutic intervention in order to improve on the following deficits Pain;Increased muscle spasms;Decreased strength;Decreased activity tolerance;Impaired flexibility   Rehab Potential Good   PT Frequency 2x / week   PT Duration 6 weeks   PT Treatment/Interventions Cryotherapy;Moist Heat;Stair training;Functional mobility training;Therapeutic activities;Therapeutic exercise;Gait training;Neuromuscular re-education;Patient/family education;Energy conservation;Traction;Ultrasound;Electrical Stimulation;ADLs/Self Care Home Management;Passive range of motion;Manual techniques   PT Next Visit Plan Continue hip and knee strength, lumbar extenson.  correct pelvis if needed   Consulted and Agree with Plan of Care Patient        Problem List Patient Active Problem List   Diagnosis Date Noted  . Unspecified vitamin D deficiency 08/29/2013  . Simple endometrial hyperplasia without atypia 2010 08/18/2013  . Melanoma 11/03/2011    TAKACS,KELLY, PT 10/24/2014, 9:30 AM  [facility] [address], [place], [place], [address] Phone: [phone]   Fax:  [phone]

## 2014-10-24 NOTE — Patient Instructions (Signed)
   Neurovascular: Sciatic Nerve Stretch With Ankle Bias - Sitting   Sit with hands behind, chin on chest, back relaxed and rounded. Straighten left knee, then pull toes toward head. Repeat __10__ times per set. Do __1__ sets per session. Do ____ sessions per week.  Copyright  VHI. All rights reserved.   [facility] [address] [place], [place] [address] Phone # [phone] Fax [phone]

## 2014-10-30 ENCOUNTER — Other Ambulatory Visit: Payer: Self-pay | Admitting: Certified Nurse Midwife

## 2014-10-30 ENCOUNTER — Other Ambulatory Visit: Payer: Self-pay | Admitting: *Deleted

## 2014-10-30 DIAGNOSIS — N951 Menopausal and female climacteric states: Secondary | ICD-10-CM

## 2014-10-30 MED ORDER — DROSPIRENONE-ESTRADIOL 0.25-0.5 MG PO TABS: 1.0000 | ORAL_TABLET | Freq: Every day | ORAL | Status: DC

## 2014-10-30 NOTE — Telephone Encounter (Signed)
Medication refill request: Angeliq  Last AEX:  09-26-14  Next AEX: not scheduled yet Last MMG (if hormonal medication request): 10-18-14 WNL Refill authorized: please advise

## 2014-10-30 NOTE — Telephone Encounter (Signed)
Patient would a written prescription for her HRT mailed to her. Patient says she has already had her MMG and BMD and the results have been posted. Patient is requesting a 3 month refill mailed to her.

## 2014-10-31 ENCOUNTER — Encounter: Payer: Self-pay | Admitting: Physical Therapy

## 2014-10-31 ENCOUNTER — Ambulatory Visit: Payer: 59 | Admitting: Physical Therapy

## 2014-10-31 DIAGNOSIS — M25652 Stiffness of left hip, not elsewhere classified: Secondary | ICD-10-CM

## 2014-10-31 DIAGNOSIS — M5432 Sciatica, left side: Secondary | ICD-10-CM | POA: Diagnosis not present

## 2014-10-31 DIAGNOSIS — R29898 Other symptoms and signs involving the musculoskeletal system: Secondary | ICD-10-CM

## 2014-10-31 NOTE — Therapy (Signed)
[facility] [address], [place], [place], [address] Phone: [phone]   Fax:  [phone]  Physical Therapy Treatment  Patient Details  Name: Kristina Delgado MRN: [id number] Date of Birth: 1955-10-28 Referring Provider:  Kathyrn Lass, MD  Encounter Date: 10/31/2014      PT End of Session - 10/31/14 0911    Visit Number 6   Date for PT Re-Evaluation 12/06/14   PT Start Time 0851   PT Stop Time 0930   PT Time Calculation (min) 39 min   Activity Tolerance Patient tolerated treatment well   Behavior During Therapy [facility] for tasks assessed/performed      Past Medical History  Diagnosis Date  . Skin cancer 09/30/2004    malignant melanoma.  . Hypertension     previous hx  . Migraine   . Simple endometrial hyperplasia without atypia 08/18/2013  . Rectal leakage   . IBS (irritable bowel syndrome)     Past Surgical History  Procedure Laterality Date  . Cholecystectomy  1980  . Wrist surgery      rt wrist  . Shoulder arthroscopy      left 2011/right 2012  . Other surgical history      full buttock revision  . Melanoma excision  2006  . Endometrial biopsy  2010    negative atypia/hyperplasia  . Cervical cerclage      There were no vitals filed for this visit.  Visit Diagnosis:  Sciatica associated with disorder of lumbar spine, left  Hip stiffness, left  Weakness of right hip      Subjective Assessment - 10/31/14 0855    Subjective Pt feels good, but  after long sitting periods gluteual muscle tighten up or discomfort in Lt post thight.   Limitations Sitting   Currently in Pain? No/denies                         [facility] Adult PT Treatment/Exercise - 10/31/14 0001    Bed Mobility   Bed Mobility --  Pt wishes to be called [name]   Lumbar Exercises: Stretches   Active Hamstring Stretch 3 reps;20 seconds  on stairs, seated and supine   Piriformis Stretch 3 reps;20 seconds   Lumbar Exercises: Aerobic   Stationary Bike Level 1 x 6 minutes   Lumbar Exercises: Supine   Bridge 20 reps   Other Supine Lumbar Exercises nerve glide.  Performed and supine   Knee/Hip Exercises: Standing   Hip Abduction 2 sets;10 reps;Both   Hip Extension 2 sets;10 reps;Both   Manual Therapy   Manual Therapy Soft tissue mobilization  to left gluteal area in prone position                  PT Short Term Goals - 10/31/14 0918    PT SHORT TERM GOAL #1   Title Independent with initial HEP    Time 4   Status Achieved   PT SHORT TERM GOAL #2   Title Report 30% decrease in low back pain with sitting activitites    Time 4   Period Weeks   Status Achieved   PT SHORT TERM GOAL #3   Title report a 30% reduction in knee pain with negotiating steps   Time 4   Period Weeks   Status On-going           PT Long Term Goals - 10/31/14 0919    PT LONG TERM GOAL #  1   Title Be independent with advanced HEP    Time 8   Period Weeks   Status On-going   PT LONG TERM GOAL #2   Title Report 50% decrease in low back pain sitting in the car    Time 8   Period Weeks   Status On-going   PT LONG TERM GOAL #3   Title Report a FOTO limitation of < or = 35%    Time 8   Period Weeks   Status On-going   PT LONG TERM GOAL #4   Title Increase hip flexion strength to 4+/5 on the Rt.    Time 8   Period Weeks   Status On-going   PT LONG TERM GOAL #5   Title Verbalize and demonstrate understanding of stretches and exericses that will alleviate Lt leg pain    Time 8   Period Weeks   Status On-going               Plan - 10/31/14 0911    Clinical Impression Statement Pt continues to expierience increase of pain with Lt LE with prolonged sitting. Pt with tight hamstrings and decreased strength lumbar and abdominal area.. Pt will continue to benefit from skilled PT    Pt will benefit from skilled therapeutic intervention in order to improve on the following deficits  Pain;Increased muscle spasms;Decreased strength;Decreased activity tolerance;Impaired flexibility   Rehab Potential Good   PT Frequency 2x / week   PT Duration 6 weeks   PT Treatment/Interventions Cryotherapy;Moist Heat;Stair training;Functional mobility training;Therapeutic activities;Therapeutic exercise;Gait training;Neuromuscular re-education;Patient/family education;Energy conservation;Traction;Ultrasound;Electrical Stimulation;ADLs/Self Care Home Management;Passive range of motion;Manual techniques   PT Next Visit Plan Continue with hip and knee strength, neural stretch with focus on left LE   PT Home Exercise Plan progress as needed   Consulted and Agree with Plan of Care Patient        Problem List Patient Active Problem List   Diagnosis Date Noted  . Unspecified vitamin D deficiency 08/29/2013  . Simple endometrial hyperplasia without atypia 2010 08/18/2013  . Melanoma 11/03/2011    NAUMANN-HOUEGNIFIO,Orpah Hausner PTA 10/31/2014, 9:36 AM  [facility] [address] [place], [place], [address] Phone: [phone]   Fax:  [phone]

## 2014-11-02 ENCOUNTER — Encounter: Payer: Self-pay | Admitting: Physical Therapy

## 2014-11-02 ENCOUNTER — Ambulatory Visit: Payer: 59 | Admitting: Physical Therapy

## 2014-11-02 DIAGNOSIS — M25652 Stiffness of left hip, not elsewhere classified: Secondary | ICD-10-CM

## 2014-11-02 DIAGNOSIS — M5432 Sciatica, left side: Secondary | ICD-10-CM | POA: Diagnosis not present

## 2014-11-02 NOTE — Therapy (Signed)
[facility] [address] [place], [place], [address] Phone: [phone]   Fax:  [phone]  Physical Therapy Treatment  Patient Details  Name: Kristina Delgado MRN: [id number] Date of Birth: May 17, 1955 Referring Provider:  Marda Stalker, PA-C  Encounter Date: 11/02/2014      PT End of Session - 11/02/14 0914    Visit Number 7   Date for PT Re-Evaluation 12/06/14   PT Start Time 0848   PT Stop Time 0945   PT Time Calculation (min) 57 min   Activity Tolerance Patient tolerated treatment well   Behavior During Therapy [facility] for tasks assessed/performed      Past Medical History  Diagnosis Date  . Skin cancer 09/30/2004    malignant melanoma.  . Hypertension     previous hx  . Migraine   . Simple endometrial hyperplasia without atypia 08/18/2013  . Rectal leakage   . IBS (irritable bowel syndrome)     Past Surgical History  Procedure Laterality Date  . Cholecystectomy  1980  . Wrist surgery      rt wrist  . Shoulder arthroscopy      left 2011/right 2012  . Other surgical history      full buttock revision  . Melanoma excision  2006  . Endometrial biopsy  2010    negative atypia/hyperplasia  . Cervical cerclage      There were no vitals filed for this visit.  Visit Diagnosis:  Sciatica associated with disorder of lumbar spine, left  Hip stiffness, left      Subjective Assessment - 11/02/14 0851    Subjective Had a new kind of burn in buttocks after last tx, it was more of a dull burn.    Currently in Pain? Yes   Pain Score 2    Pain Location Leg   Pain Orientation Left   Pain Descriptors / Indicators Dull   Aggravating Factors  Sitting in low seat   Pain Relieving Factors Walking   Multiple Pain Sites No                         [facility] Adult PT Treatment/Exercise - 11/02/14 0001    Lumbar Exercises: Stretches   Piriformis Stretch --  Piriformis release protocol in  supine with spikey green ball   Lumbar Exercises: Supine   Ab Set 10 reps   Moist Heat Therapy   Number Minutes Moist Heat 15 Minutes   Moist Heat Location Lumbar Spine   Electrical Stimulation   Electrical Stimulation Location LT lumabr to gluteals   Electrical Stimulation Action Hooklying   Electrical Stimulation Goals Pain                PT Education - 11/02/14 0906    Education provided Yes   Education Details Release protocol with spikey ball and Miracle method balls.   Person(s) Educated Patient   Methods Explanation;Demonstration;Tactile cues;Verbal cues   Comprehension Returned demonstration;Verbalized understanding          PT Short Term Goals - 10/31/14 5462    PT SHORT TERM GOAL #1   Title Independent with initial HEP    Time 4   Status Achieved   PT SHORT TERM GOAL #2   Title Report 30% decrease in low back pain with sitting activitites    Time 4   Period Weeks   Status Achieved   PT SHORT TERM GOAL #3   Title report a  30% reduction in knee pain with negotiating steps   Time 4   Period Weeks   Status On-going           PT Long Term Goals - 10/31/14 0919    PT LONG TERM GOAL #1   Title Be independent with advanced HEP    Time 8   Period Weeks   Status On-going   PT LONG TERM GOAL #2   Title Report 50% decrease in low back pain sitting in the car    Time 8   Period Weeks   Status On-going   PT LONG TERM GOAL #3   Title Report a FOTO limitation of < or = 35%    Time 8   Period Weeks   Status On-going   PT LONG TERM GOAL #4   Title Increase hip flexion strength to 4+/5 on the Rt.    Time 8   Period Weeks   Status On-going   PT LONG TERM GOAL #5   Title Verbalize and demonstrate understanding of stretches and exericses that will alleviate Lt leg pain    Time 8   Period Weeks   Status On-going               Plan - 11/02/14 0915    Clinical Impression Statement Pt had some different pain in her buttocks early in week.  Today tried some release work for piriformis and connective tissue to help reduce pain symptoms. She has a ball at home she might continue the technique wiht.    Pt will benefit from skilled therapeutic intervention in order to improve on the following deficits Pain;Increased muscle spasms;Decreased strength;Decreased activity tolerance;Impaired flexibility   Rehab Potential Good   Clinical Impairments Affecting Rehab Potential None   PT Frequency 2x / week   PT Treatment/Interventions Cryotherapy;Moist Heat;Stair training;Functional mobility training;Therapeutic activities;Therapeutic exercise;Gait training;Neuromuscular re-education;Patient/family education;Energy conservation;Traction;Ultrasound;Electrical Stimulation;ADLs/Self Care Home Management;Passive range of motion;Manual techniques   PT Next Visit Plan See if pt benefitted from release work and look at core strength, cont with LT hip ROM and strength    Consulted and Agree with Plan of Care Patient        Problem List Patient Active Problem List   Diagnosis Date Noted  . Unspecified vitamin D deficiency 08/29/2013  . Simple endometrial hyperplasia without atypia 2010 08/18/2013  . Melanoma 11/03/2011    Khia Dieterich, PTA 11/02/2014, 9:39 AM  [facility] [address] [place], [place], [address] Phone: [phone]   Fax:  [phone]

## 2014-11-05 ENCOUNTER — Encounter: Payer: Self-pay | Admitting: Physical Therapy

## 2014-11-05 ENCOUNTER — Ambulatory Visit: Payer: 59 | Admitting: Physical Therapy

## 2014-11-05 DIAGNOSIS — M5432 Sciatica, left side: Secondary | ICD-10-CM | POA: Diagnosis not present

## 2014-11-05 DIAGNOSIS — R29898 Other symptoms and signs involving the musculoskeletal system: Secondary | ICD-10-CM

## 2014-11-05 DIAGNOSIS — M25652 Stiffness of left hip, not elsewhere classified: Secondary | ICD-10-CM

## 2014-11-05 NOTE — Therapy (Signed)
[facility] [address] [place], [place], [address] Phone: [phone]   Fax:  [phone]  Physical Therapy Treatment  Patient Details  Name: Kristina Delgado MRN: [id number] Date of Birth: 03-16-1956 Referring Provider:  Kathyrn Lass, MD  Encounter Date: 11/05/2014      PT End of Session - 11/05/14 0918    Visit Number 8   Date for PT Re-Evaluation 12/06/14   PT Start Time 6948   PT Stop Time 0950   PT Time Calculation (min) 56 min   Activity Tolerance Patient tolerated treatment well   Behavior During Therapy [facility] for tasks assessed/performed      Past Medical History  Diagnosis Date  . Skin cancer 09/30/2004    malignant melanoma.  . Hypertension     previous hx  . Migraine   . Simple endometrial hyperplasia without atypia 08/18/2013  . Rectal leakage   . IBS (irritable bowel syndrome)     Past Surgical History  Procedure Laterality Date  . Cholecystectomy  1980  . Wrist surgery      rt wrist  . Shoulder arthroscopy      left 2011/right 2012  . Other surgical history      full buttock revision  . Melanoma excision  2006  . Endometrial biopsy  2010    negative atypia/hyperplasia  . Cervical cerclage      There were no vitals filed for this visit.  Visit Diagnosis:  Sciatica associated with disorder of lumbar spine, left  Hip stiffness, left  Weakness of right hip      Subjective Assessment - 11/05/14 0857    Subjective Was good until that afternoon. Had dull burn in post LT thigh but this went away and pt has had no symptoms since.    Currently in Pain? No/denies   Multiple Pain Sites No                         [facility] Adult PT Treatment/Exercise - 11/05/14 0001    Lumbar Exercises: Supine   Ab Set --  TA contraction 8x 3 sec hold   Bent Knee Raise 20 reps   Other Supine Lumbar Exercises TA exercises protocol   Other Supine Lumbar Exercises Soft foam roll ex  protocol for decompression   Moist Heat Therapy   Number Minutes Moist Heat 15 Minutes   Moist Heat Location Lumbar Spine   Electrical Stimulation   Electrical Stimulation Location LT lumbar   Electrical Stimulation Action Hoioklying   Electrical Stimulation Goals Pain                PT Education - 11/05/14 0904    Education provided Yes   Education Details HEP core contraction with leg movements   Person(s) Educated Patient   Methods Explanation;Demonstration;Verbal cues;Tactile cues;Handout   Comprehension Verbalized understanding;Returned demonstration          PT Short Term Goals - 11/05/14 0901    PT SHORT TERM GOAL #1   Title Independent with initial HEP    Period Weeks   Status Achieved   PT SHORT TERM GOAL #2   Title Report 30% decrease in low back pain with sitting activitites    Period Weeks   Status Achieved   PT SHORT TERM GOAL #3   Title report a 30% reduction in knee pain with negotiating steps   Time 4   Period Weeks   Status On-going  Too  inconsistent to say per pt report           PT Long Term Goals - 10/31/14 0919    PT LONG TERM GOAL #1   Title Be independent with advanced HEP    Time 8   Period Weeks   Status On-going   PT LONG TERM GOAL #2   Title Report 50% decrease in low back pain sitting in the car    Time 8   Period Weeks   Status On-going   PT LONG TERM GOAL #3   Title Report a FOTO limitation of < or = 35%    Time 8   Period Weeks   Status On-going   PT LONG TERM GOAL #4   Title Increase hip flexion strength to 4+/5 on the Rt.    Time 8   Period Weeks   Status On-going   PT LONG TERM GOAL #5   Title Verbalize and demonstrate understanding of stretches and exericses that will alleviate Lt leg pain    Time 8   Period Weeks   Status On-going               Plan - 11/05/14 6203    Clinical Impression Statement Pt had some dull post thigh symptoms 1x since last tx. She is finding the release work with the  ball and/or foam rolls is helping her symptoms. Added level 1 core exs to her HEP today.    Pt will benefit from skilled therapeutic intervention in order to improve on the following deficits Pain;Increased muscle spasms;Decreased strength;Decreased activity tolerance;Impaired flexibility   Rehab Potential Good   Clinical Impairments Affecting Rehab Potential None   PT Frequency 2x / week   PT Duration 6 weeks   PT Treatment/Interventions Cryotherapy;Moist Heat;Stair training;Functional mobility training;Therapeutic activities;Therapeutic exercise;Gait training;Neuromuscular re-education;Patient/family education;Energy conservation;Traction;Ultrasound;Electrical Stimulation;ADLs/Self Care Home Management;Passive range of motion;Manual techniques   PT Next Visit Plan Continue with core strenght and foam roll exercises.   Consulted and Agree with Plan of Care Patient        Problem List Patient Active Problem List   Diagnosis Date Noted  . Unspecified vitamin D deficiency 08/29/2013  . Simple endometrial hyperplasia without atypia 2010 08/18/2013  . Melanoma 11/03/2011    COCHRAN,JENNIFER , PTA  11/05/2014, 9:35 AM  [facility] [address] [place], [place], [address] Phone: [phone]   Fax:  [phone]

## 2014-11-05 NOTE — Patient Instructions (Signed)

## 2014-11-08 ENCOUNTER — Ambulatory Visit: Payer: 59 | Admitting: Physical Therapy

## 2014-11-08 ENCOUNTER — Encounter: Payer: Self-pay | Admitting: Physical Therapy

## 2014-11-08 DIAGNOSIS — M25652 Stiffness of left hip, not elsewhere classified: Secondary | ICD-10-CM

## 2014-11-08 DIAGNOSIS — M5432 Sciatica, left side: Secondary | ICD-10-CM | POA: Diagnosis not present

## 2014-11-08 DIAGNOSIS — R29898 Other symptoms and signs involving the musculoskeletal system: Secondary | ICD-10-CM

## 2014-11-08 NOTE — Therapy (Signed)
[facility] [address] [place], [place], [address] Phone: [phone]   Fax:  [phone]  Physical Therapy Treatment  Patient Details  Name: Kristina Delgado MRN: [id number] Date of Birth: [birth year] Referring Provider:  Kathyrn Lass, MD  Encounter Date: 11/08/2014      PT End of Session - 11/08/14 0926    Visit Number 9   Date for PT Re-Evaluation 12/06/14   PT Start Time 0845   PT Stop Time 0944   PT Time Calculation (min) 59 min   Activity Tolerance Patient tolerated treatment well   Behavior During Therapy [facility] for tasks assessed/performed      Past Medical History  Diagnosis Date  . Skin cancer 09/30/2004    malignant melanoma.  . Hypertension     previous hx  . Migraine   . Simple endometrial hyperplasia without atypia 08/18/2013  . Rectal leakage   . IBS (irritable bowel syndrome)     Past Surgical History  Procedure Laterality Date  . Cholecystectomy  1980  . Wrist surgery      rt wrist  . Shoulder arthroscopy      left 2011/right 2012  . Other surgical history      full buttock revision  . Melanoma excision  2006  . Endometrial biopsy  2010    negative atypia/hyperplasia  . Cervical cerclage      There were no vitals filed for this visit.  Visit Diagnosis:  Sciatica associated with disorder of lumbar spine, left  Hip stiffness, left  Weakness of right hip      Subjective Assessment - 11/08/14 0900    Subjective Had a bad night had to take muscle relaxers, now low back and right side is aggravated, rated as 4/10   Currently in Pain? Yes   Pain Score 4    Pain Location Back   Pain Orientation Left   Pain Descriptors / Indicators Aching;Spasm   Pain Type Chronic pain   Pain Onset More than a month ago   Pain Frequency Intermittent   Multiple Pain Sites No                         [facility] Adult PT Treatment/Exercise - 11/08/14 0001    Bed Mobility   Bed  Mobility --  Pt wishes to be called [name]   Lumbar Exercises: Stretches   Active Hamstring Stretch 3 reps;20 seconds  Neural stretch with Af by PTA 3 x 10 DF   Piriformis Stretch --  unable to perform piri stretch in sitting, but after STW abl   Lumbar Exercises: Supine   Other Supine Lumbar Exercises TA activation, without leg movement due to pain today   Other Supine Lumbar Exercises Lt lE info flex/ER 3x10   Moist Heat Therapy   Number Minutes Moist Heat 20 Minutes   Moist Heat Location Lumbar Spine   Electrical Stimulation   Electrical Stimulation Location LT lumbar   Electrical Stimulation Action IFC   Electrical Stimulation Parameters 80-150Hz    Electrical Stimulation Goals Pain   Manual Therapy   Manual Therapy Passive ROM  L LE to release muscle tightness in low back with STW    Soft tissue mobilization STW to Lt gluteal area along sacral border, with PROM for elongation                  PT Short Term Goals - 11/05/14 0901    PT SHORT  TERM GOAL #1   Title Independent with initial HEP    Period Weeks   Status Achieved   PT SHORT TERM GOAL #2   Title Report 30% decrease in low back pain with sitting activitites    Period Weeks   Status Achieved   PT SHORT TERM GOAL #3   Title report a 30% reduction in knee pain with negotiating steps   Time 4   Period Weeks   Status On-going  Too inconsistent to say per pt report           PT Long Term Goals - 10/31/14 0919    PT LONG TERM GOAL #1   Title Be independent with advanced HEP    Time 8   Period Weeks   Status On-going   PT LONG TERM GOAL #2   Title Report 50% decrease in low back pain sitting in the car    Time 8   Period Weeks   Status On-going   PT LONG TERM GOAL #3   Title Report a FOTO limitation of < or = 35%    Time 8   Period Weeks   Status On-going   PT LONG TERM GOAL #4   Title Increase hip flexion strength to 4+/5 on the Rt.    Time 8   Period Weeks   Status On-going   PT LONG TERM  GOAL #5   Title Verbalize and demonstrate understanding of stretches and exericses that will alleviate Lt leg pain    Time 8   Period Weeks   Status On-going               Plan - 11/08/14 0927    Clinical Impression Statement Pt limited due to pain, but after PROM, stretching and softtissue work able to perform piriformis stretch and feeling improvement.Pt will continue to benefit from PT to control pain.   Rehab Potential Good   PT Frequency 2x / week   PT Duration 6 weeks   PT Treatment/Interventions Cryotherapy;Moist Heat;Stair training;Functional mobility training;Therapeutic activities;Therapeutic exercise;Gait training;Neuromuscular re-education;Patient/family education;Energy conservation;Traction;Ultrasound;Electrical Stimulation;ADLs/Self Care Home Management;Passive range of motion;Manual techniques   PT Next Visit Plan Continue with core strenght and foam roll exercises.   PT Home Exercise Plan progress as needed   Consulted and Agree with Plan of Care Patient        Problem List Patient Active Problem List   Diagnosis Date Noted  . Unspecified vitamin D deficiency 08/29/2013  . Simple endometrial hyperplasia without atypia 2010 08/18/2013  . Melanoma 11/03/2011    NAUMANN-HOUEGNIFIO,Ermina Oberman PTA 11/08/2014, 9:30 AM  [facility] [address] [place], [place], [address] Phone: [phone]   Fax:  [phone]

## 2014-11-13 ENCOUNTER — Ambulatory Visit: Payer: 59

## 2014-11-13 DIAGNOSIS — M25652 Stiffness of left hip, not elsewhere classified: Secondary | ICD-10-CM

## 2014-11-13 DIAGNOSIS — M5432 Sciatica, left side: Secondary | ICD-10-CM | POA: Diagnosis not present

## 2014-11-13 DIAGNOSIS — R29898 Other symptoms and signs involving the musculoskeletal system: Secondary | ICD-10-CM

## 2014-11-13 NOTE — Therapy (Signed)
[facility] [address], [place], [place], [address] Phone: [phone]   Fax:  [phone]  Physical Therapy Treatment  Patient Details  Name: Kristina Delgado MRN: [id number] Date of Birth: [birth year] Referring Provider:  Marda Stalker, PA-C  Encounter Date: 11/13/2014      PT End of Session - 11/13/14 1013    Visit Number 10   Date for PT Re-Evaluation 12/06/14   PT Start Time 0935   PT Stop Time 1029   PT Time Calculation (min) 54 min   Activity Tolerance Patient tolerated treatment well   Behavior During Therapy [facility] for tasks assessed/performed      Past Medical History  Diagnosis Date  . Skin cancer 09/30/2004    malignant melanoma.  . Hypertension     previous hx  . Migraine   . Simple endometrial hyperplasia without atypia 08/18/2013  . Rectal leakage   . IBS (irritable bowel syndrome)     Past Surgical History  Procedure Laterality Date  . Cholecystectomy  1980  . Wrist surgery      rt wrist  . Shoulder arthroscopy      left 2011/right 2012  . Other surgical history      full buttock revision  . Melanoma excision  2006  . Endometrial biopsy  2010    negative atypia/hyperplasia  . Cervical cerclage      There were no vitals filed for this visit.  Visit Diagnosis:  Sciatica associated with disorder of lumbar spine, left  Hip stiffness, left  Weakness of right hip      Subjective Assessment - 11/13/14 0936    Subjective Pt is feeling better now.  Had a flare-up over the weekend.  Might be related to housework and sitting in the car.   Currently in Pain? Yes   Pain Score 2    Pain Location Back   Pain Orientation Left   Pain Descriptors / Indicators Aching;Spasm   Pain Type Chronic pain   Pain Onset More than a month ago   Pain Frequency Intermittent   Aggravating Factors  sitting in low seat, housework   Pain Relieving Factors heat, pelvic tilt   Multiple Pain Sites No             [facility] PT Assessment - 11/13/14 0001    Strength   Strength Assessment Site Hip   Right/Left Hip Right   Right Hip Flexion 4+/5                     [facility] Adult PT Treatment/Exercise - 11/13/14 0001    Lumbar Exercises: Stretches   Active Hamstring Stretch 3 reps;20 seconds   Single Knee to Chest Stretch 3 reps;20 seconds   Lower Trunk Rotation 3 reps;20 seconds   Piriformis Stretch 3 reps;20 seconds  seated   Lumbar Exercises: Supine   Straight Leg Raise 20 reps   Moist Heat Therapy   Number Minutes Moist Heat 15 Minutes   Moist Heat Location Lumbar Spine   Electrical Stimulation   Electrical Stimulation Location Bil lumbar and gluteals   Electrical Stimulation Action IFC   Electrical Stimulation Parameters 15 minutes   Electrical Stimulation Goals Pain   Manual Therapy   Joint Mobilization PA glides Grade II-III L1-5   Soft tissue mobilization Lt and Rt deep gluteals with hip rotation                  PT Short Term Goals -  11/13/14 0945    PT SHORT TERM GOAL #3   Title report a 30% reduction in knee pain with negotiating steps   Time 4   Period Weeks   Status Achieved  intermittent without difficulty lately           PT Long Term Goals - 11/13/14 0946    PT LONG TERM GOAL #2   Title Report 50% decrease in low back pain sitting in the car    Time 8   Period Weeks   Status On-going  flare-up last week so not able to assess   PT LONG TERM GOAL #4   Title Increase hip flexion strength to 4+/5 on the Rt.    Time 8   Period Weeks   Status Achieved   PT LONG TERM GOAL #5   Title Verbalize and demonstrate understanding of stretches and exericses that will alleviate Lt leg pain    Status Achieved               Plan - 11/13/14 0950    Clinical Impression Statement Pt with flare-up last week.  Now feeling back to baseline.  Rt hip strength now 4+/5: goal met.  Pt with continued Rt LE pain and LBP with sitting in the  car and with housework.  Pt is independent and compliant with HEP for core strength and flexibility.  Pt with stiffness in the lumbar spine with mobs.  Pt will benefit from PT for pain management and advancement of HEP for strength and flexibility.     Pt will benefit from skilled therapeutic intervention in order to improve on the following deficits Pain;Increased muscle spasms;Decreased strength;Decreased activity tolerance;Impaired flexibility   Rehab Potential Good   PT Frequency 2x / week   PT Duration 6 weeks   PT Treatment/Interventions Cryotherapy;Moist Heat;Stair training;Functional mobility training;Therapeutic activities;Therapeutic exercise;Gait training;Neuromuscular re-education;Patient/family education;Energy conservation;Traction;Ultrasound;Electrical Stimulation;ADLs/Self Care Home Management;Passive range of motion;Manual techniques   PT Next Visit Plan Continue with core strength, modalities, flexibility   Consulted and Agree with Plan of Care Patient        Problem List Patient Active Problem List   Diagnosis Date Noted  . Unspecified vitamin D deficiency 08/29/2013  . Simple endometrial hyperplasia without atypia 2010 08/18/2013  . Melanoma 11/03/2011    TAKACS,KELLY, PT 11/13/2014, 10:15 AM  [facility] [address] [place], [place], [address] Phone: [phone]   Fax:  [phone]

## 2014-11-15 ENCOUNTER — Ambulatory Visit: Payer: 59

## 2014-11-15 DIAGNOSIS — M5432 Sciatica, left side: Secondary | ICD-10-CM | POA: Diagnosis not present

## 2014-11-15 DIAGNOSIS — M25652 Stiffness of left hip, not elsewhere classified: Secondary | ICD-10-CM

## 2014-11-15 DIAGNOSIS — R29898 Other symptoms and signs involving the musculoskeletal system: Secondary | ICD-10-CM

## 2014-11-15 NOTE — Therapy (Signed)
[facility] [address], [place], [place], [address] Phone: [phone]   Fax:  [phone]  Physical Therapy Treatment  Patient Details  Name: Kristina Delgado MRN: [id number] Date of Birth: Dec 29, 1955 Referring Provider:  Marda Stalker, PA-C  Encounter Date: 11/15/2014      PT End of Session - 11/15/14 1001    Visit Number 11   Date for PT Re-Evaluation 12/06/14   PT Start Time 0934   PT Stop Time 1025   PT Time Calculation (min) 51 min   Activity Tolerance Patient tolerated treatment well   Behavior During Therapy [facility] for tasks assessed/performed      Past Medical History  Diagnosis Date  . Skin cancer 09/30/2004    malignant melanoma.  . Hypertension     previous hx  . Migraine   . Simple endometrial hyperplasia without atypia 08/18/2013  . Rectal leakage   . IBS (irritable bowel syndrome)     Past Surgical History  Procedure Laterality Date  . Cholecystectomy  1980  . Wrist surgery      rt wrist  . Shoulder arthroscopy      left 2011/right 2012  . Other surgical history      full buttock revision  . Melanoma excision  2006  . Endometrial biopsy  2010    negative atypia/hyperplasia  . Cervical cerclage      There were no vitals filed for this visit.  Visit Diagnosis:  Sciatica associated with disorder of lumbar spine, left  Hip stiffness, left  Weakness of right hip      Subjective Assessment - 11/15/14 0933    Subjective Starting to panic because I am going back to [place] soon.     Currently in Pain? Yes   Pain Score 5    Pain Location Back   Pain Orientation Left   Pain Descriptors / Indicators Aching;Spasm   Pain Type Chronic pain                         [facility] Adult PT Treatment/Exercise - 11/15/14 0001    Lumbar Exercises: Stretches   Active Hamstring Stretch 3 reps;20 seconds   Single Knee to Chest Stretch 3 reps;20 seconds   Lower Trunk Rotation 3  reps;20 seconds   Piriformis Stretch 3 reps;20 seconds  seated   Lumbar Exercises: Supine   Bridge 20 reps;5 seconds   Straight Leg Raise 20 reps   Moist Heat Therapy   Number Minutes Moist Heat 15 Minutes   Moist Heat Location Lumbar Spine   Electrical Stimulation   Electrical Stimulation Location bil lumbar spine and Rt/Lt gluteals   Electrical Stimulation Action IFC   Electrical Stimulation Parameters 15   Electrical Stimulation Goals Pain   Manual Therapy   Manual Therapy Soft tissue mobilization;Joint mobilization   Joint Mobilization PA glides Grade II-III L1-5   Soft tissue mobilization Lt and Rt deep gluteals with hip rotation   Muscle Energy Technique contract relax to Rt hamstring to correct Rt anterior rotation of ilium                  PT Short Term Goals - 11/13/14 0945    PT SHORT TERM GOAL #3   Title report a 30% reduction in knee pain with negotiating steps   Time 4   Period Weeks   Status Achieved  intermittent without difficulty lately  PT Long Term Goals - 11/13/14 0946    PT LONG TERM GOAL #2   Title Report 50% decrease in low back pain sitting in the car    Time 8   Period Weeks   Status On-going  flare-up last week so not able to assess   PT LONG TERM GOAL #4   Title Increase hip flexion strength to 4+/5 on the Rt.    Time 8   Period Weeks   Status Achieved   PT LONG TERM GOAL #5   Title Verbalize and demonstrate understanding of stretches and exericses that will alleviate Lt leg pain    Status Achieved               Plan - 11/15/14 0959    Clinical Impression Statement Pt with Rt anterior rotation of pelvis today.  Contract relax used to contract.  Pt woke with flare-up of pain today without cause.  Pt felt good after last treatment sesison.  Pt will benefit from skilled PT for pain management and advancement of HEP for strength and flexibility.    Pt will benefit from skilled therapeutic intervention in order to  improve on the following deficits Pain;Increased muscle spasms;Decreased strength;Decreased activity tolerance;Impaired flexibility   Rehab Potential Good   PT Frequency 2x / week   PT Duration 6 weeks   PT Treatment/Interventions Cryotherapy;Moist Heat;Stair training;Functional mobility training;Therapeutic activities;Therapeutic exercise;Gait training;Neuromuscular re-education;Patient/family education;Energy conservation;Traction;Ultrasound;Electrical Stimulation;ADLs/Self Care Home Management;Passive range of motion;Manual techniques   PT Next Visit Plan Continue with core strength, modalities, flexibility   Consulted and Agree with Plan of Care Patient        Problem List Patient Active Problem List   Diagnosis Date Noted  . Unspecified vitamin D deficiency 08/29/2013  . Simple endometrial hyperplasia without atypia 2010 08/18/2013  . Melanoma 11/03/2011    TAKACS,KELLY, PT 11/15/2014, 10:10 AM  [facility] [address], [place], [place], [address] Phone: [phone]   Fax:  [phone]

## 2014-11-20 ENCOUNTER — Ambulatory Visit: Payer: 59

## 2014-11-20 DIAGNOSIS — M5432 Sciatica, left side: Secondary | ICD-10-CM | POA: Diagnosis not present

## 2014-11-20 DIAGNOSIS — M25652 Stiffness of left hip, not elsewhere classified: Secondary | ICD-10-CM

## 2014-11-20 DIAGNOSIS — R29898 Other symptoms and signs involving the musculoskeletal system: Secondary | ICD-10-CM

## 2014-11-20 NOTE — Therapy (Signed)
[facility] [address], [place], [place], [address] Phone: [phone]   Fax:  [phone]  Physical Therapy Treatment  Patient Details  Name: Kristina Delgado MRN: [id number] Date of Birth: January 03, 1956 Referring Provider:  Kathyrn Lass, MD  Encounter Date: 11/20/2014      PT End of Session - 11/20/14 1051    Visit Number 12   Date for PT Re-Evaluation 12/06/14   PT Start Time 1021   PT Stop Time 1109   PT Time Calculation (min) 48 min   Activity Tolerance Patient tolerated treatment well   Behavior During Therapy [facility] for tasks assessed/performed      Past Medical History  Diagnosis Date  . Skin cancer 09/30/2004    malignant melanoma.  . Hypertension     previous hx  . Migraine   . Simple endometrial hyperplasia without atypia 08/18/2013  . Rectal leakage   . IBS (irritable bowel syndrome)     Past Surgical History  Procedure Laterality Date  . Cholecystectomy  1980  . Wrist surgery      rt wrist  . Shoulder arthroscopy      left 2011/right 2012  . Other surgical history      full buttock revision  . Melanoma excision  2006  . Endometrial biopsy  2010    negative atypia/hyperplasia  . Cervical cerclage      There were no vitals filed for this visit.  Visit Diagnosis:  Sciatica associated with disorder of lumbar spine, left  Hip stiffness, left  Weakness of right hip      Subjective Assessment - 11/20/14 1026    Subjective Did well over the weekend.     Currently in Pain? Yes   Pain Score 0-No pain  it was 2/10 over the weekend   Pain Location Back   Pain Orientation Left   Pain Descriptors / Indicators Aching   Pain Type Chronic pain   Pain Onset More than a month ago   Pain Frequency Intermittent   Aggravating Factors  sitting in low car seat   Pain Relieving Factors heat, pelvic tilt, stretching                         [facility] Adult PT Treatment/Exercise -  11/20/14 0001    Lumbar Exercises: Stretches   Active Hamstring Stretch 3 reps;20 seconds   Single Knee to Chest Stretch 3 reps;20 seconds   Lower Trunk Rotation 3 reps;20 seconds   Knee/Hip Exercises: Supine   Other Supine Knee/Hip Exercises orange ball under Lt gluteals with hip ER and abduction   Moist Heat Therapy   Number Minutes Moist Heat 15 Minutes   Moist Heat Location Lumbar Spine   Electrical Stimulation   Electrical Stimulation Location bil lumbar spine and Rt/Lt gluteals   Electrical Stimulation Action IFC   Electrical Stimulation Parameters 15   Electrical Stimulation Goals Pain   Manual Therapy   Manual Therapy Soft tissue mobilization;Joint mobilization   Joint Mobilization PA glides Grade II-III L1-5   Soft tissue mobilization Lt and Rt deep gluteals with hip rotation                  PT Short Term Goals - 11/13/14 0945    PT SHORT TERM GOAL #3   Title report a 30% reduction in knee pain with negotiating steps   Time 4   Period Weeks   Status Achieved  intermittent without  difficulty lately           PT Long Term Goals - 11/20/14 1031    PT LONG TERM GOAL #1   Title Be independent with advanced HEP    Time 8   Status On-going   PT LONG TERM GOAL #2   Title Report 50% decrease in low back pain sitting in the car    Status Achieved  50% better   PT LONG TERM GOAL #4   Title Increase hip flexion strength to 4+/5 on the Rt.    Status Achieved               Plan - 11/20/14 1047    Clinical Impression Statement Pt with incresaed tension in Rt gluteals vs Rt today with manual therapy.  Pt reports 90% overall improvement since the start of care.  Pt with pain with sitting in low car seat and and with long car rides.  Pt will benefit from 1 more PT session for manual, modlalities and to finalize HEP before D/C   Pt will benefit from skilled therapeutic intervention in order to improve on the following deficits Pain;Increased muscle  spasms;Decreased strength;Decreased activity tolerance;Impaired flexibility   Rehab Potential Good   PT Frequency 2x / week   PT Duration 6 weeks   PT Treatment/Interventions Cryotherapy;Moist Heat;Stair training;Functional mobility training;Therapeutic activities;Therapeutic exercise;Gait training;Neuromuscular re-education;Patient/family education;Energy conservation;Traction;Ultrasound;Electrical Stimulation;ADLs/Self Care Home Management;Passive range of motion;Manual techniques   PT Next Visit Plan D/C next session. FOTO.    Consulted and Agree with Plan of Care Patient        Problem List Patient Active Problem List   Diagnosis Date Noted  . Unspecified vitamin D deficiency 08/29/2013  . Simple endometrial hyperplasia without atypia 2010 08/18/2013  . Melanoma 11/03/2011    Hoby Kawai, PT 11/20/2014, 10:55 AM   [facility] [address] [place], [place], [address] Phone: [phone]   Fax:  [phone]

## 2014-11-29 ENCOUNTER — Ambulatory Visit: Payer: 59 | Attending: Family Medicine

## 2014-11-29 DIAGNOSIS — M25652 Stiffness of left hip, not elsewhere classified: Secondary | ICD-10-CM | POA: Insufficient documentation

## 2014-11-29 DIAGNOSIS — R29898 Other symptoms and signs involving the musculoskeletal system: Secondary | ICD-10-CM | POA: Diagnosis present

## 2014-11-29 DIAGNOSIS — M5432 Sciatica, left side: Secondary | ICD-10-CM | POA: Diagnosis not present

## 2014-11-29 NOTE — Therapy (Signed)
[facility] [address] [place], [place], [address] Phone: [phone]   Fax:  [phone]  Physical Therapy Treatment  Patient Details  Name: Kristina Delgado MRN: [id number] Date of Birth: March 09, 1956 Referring Provider:  Kathyrn Lass, MD  Encounter Date: 11/29/2014      PT End of Session - 11/29/14 0926    Visit Number 13   PT Start Time 0849   PT Stop Time 0940   PT Time Calculation (min) 51 min   Activity Tolerance Patient tolerated treatment well   Behavior During Therapy [facility] for tasks assessed/performed      Past Medical History  Diagnosis Date  . Skin cancer 09/30/2004    malignant melanoma.  . Hypertension     previous hx  . Migraine   . Simple endometrial hyperplasia without atypia 08/18/2013  . Rectal leakage   . IBS (irritable bowel syndrome)     Past Surgical History  Procedure Laterality Date  . Cholecystectomy  1980  . Wrist surgery      rt wrist  . Shoulder arthroscopy      left 2011/right 2012  . Other surgical history      full buttock revision  . Melanoma excision  2006  . Endometrial biopsy  2010    negative atypia/hyperplasia  . Cervical cerclage      There were no vitals filed for this visit.  Visit Diagnosis:  Sciatica associated with disorder of lumbar spine, left  Hip stiffness, left  Weakness of right hip      Subjective Assessment - 11/29/14 0852    Subjective Watched position with sitting in the car.     Currently in Pain? Yes   Pain Score 2    Pain Location Back   Pain Orientation Left   Pain Descriptors / Indicators Aching   Pain Type Chronic pain   Pain Onset More than a month ago   Pain Frequency Intermittent   Aggravating Factors  sitting in the car, low seat or long periods, waking in the morning   Pain Relieving Factors heat, pelvic tilt, stretching, sitting up straight.            [facility] PT Assessment - 11/29/14 0001    Assessment   Medical Diagnosis M54.30 - Sciatica, M25.569 - Knee pain   Onset Date/Surgical Date 10/11/11   Observation/Other Assessments   Focus on Therapeutic Outcomes (FOTO)  34% limitation   Strength   Strength Assessment Site Hip   Right/Left Hip Right;Left   Right Hip Flexion 5/5   Left Hip Flexion 5/5                     [facility] Adult PT Treatment/Exercise - 11/29/14 0001    Lumbar Exercises: Stretches   Active Hamstring Stretch 3 reps;20 seconds   Single Knee to Chest Stretch 3 reps;20 seconds   Lower Trunk Rotation 3 reps;20 seconds   Piriformis Stretch 3 reps;20 seconds  seated   Lumbar Exercises: Supine   Bridge 20 reps;5 seconds   Straight Leg Raise 20 reps   Straight Leg Raises Limitations 2# added   Moist Heat Therapy   Number Minutes Moist Heat 15 Minutes   Moist Heat Location Lumbar Spine   Electrical Stimulation   Electrical Stimulation Location bil lumbar parapsinals   Electrical Stimulation Action IFC   Electrical Stimulation Parameters 15   Electrical Stimulation Goals Pain   Manual Therapy   Manual Therapy Soft tissue mobilization;Joint  mobilization   Joint Mobilization PA glides Grade II-III L1-5   Soft tissue mobilization Lt and Rt deep gluteals with hip rotation                  PT Short Term Goals - 11/13/14 0945    PT SHORT TERM GOAL #3   Title report a 30% reduction in knee pain with negotiating steps   Time 4   Period Weeks   Status Achieved  intermittent without difficulty lately           PT Long Term Goals - 11/29/14 0854    PT LONG TERM GOAL #1   Title Be independent with advanced HEP    Status Achieved   PT LONG TERM GOAL #2   Title Report 50% decrease in low back pain sitting in the car    Status Achieved  90% better   PT LONG TERM GOAL #3   Title Report a FOTO limitation of < or = 35%    Status Achieved  34% limitation   PT LONG TERM GOAL #4   Title Increase hip flexion strength to 4+/5 on the Rt.    Status  Achieved  5/5 bilateral hip flexor strength   PT LONG TERM GOAL #5   Title Verbalize and demonstrate understanding of stretches and exericses that will alleviate Lt leg pain    Status Achieved               Plan - 11/29/14 0901    Clinical Impression Statement Pt reports 90% overall pain reduction with riding in the car.  Pt is independent in all HEP and compliant with all daily.  FOTO score is 34% limitation.  Pt is going back to [place] and will D/C to HEP at this time.  All goals met.     PT Next Visit Plan D/C PT to HEP   Consulted and Agree with Plan of Care Patient        Problem List Patient Active Problem List   Diagnosis Date Noted  . Unspecified vitamin D deficiency 08/29/2013  . Simple endometrial hyperplasia without atypia 2010 08/18/2013  . Melanoma 11/03/2011   PHYSICAL THERAPY DISCHARGE SUMMARY  Visits from Start of Care: 13  Current functional level related to goals / functional outcomes: See above for current status.      Remaining deficits: Pt with LE pain with sitting long periods or in low seat in the car.  Pt had HEP in place for remaining deficits.  Pt will be traveling to [place] and will be discharged to HEP.     Education / Equipment: HEP, posture/body mechanics Plan: Patient agrees to discharge.  Patient goals were met. Patient is being discharged due to meeting the stated rehab goals.  ?????     Regnald Bowens, PT 11/29/2014, 9:28 AM  [facility] [address], [place], [place], [address] Phone: [phone]   Fax:  [phone]

## 2015-09-19 ENCOUNTER — Other Ambulatory Visit: Payer: Self-pay | Admitting: Certified Nurse Midwife

## 2015-09-19 ENCOUNTER — Other Ambulatory Visit: Payer: Self-pay | Admitting: *Deleted

## 2015-09-19 DIAGNOSIS — Z1231 Encounter for screening mammogram for malignant neoplasm of breast: Secondary | ICD-10-CM

## 2015-09-19 DIAGNOSIS — N951 Menopausal and female climacteric states: Secondary | ICD-10-CM

## 2015-09-19 NOTE — Telephone Encounter (Signed)
Patient per last  Note was planning to wean off HRT. Will need phone call

## 2015-09-19 NOTE — Telephone Encounter (Signed)
Medication refill request: OCP Last AEX:  09-25-14 Next AEX: 11-13-15 Last MMG (if hormonal medication request): 10-18-14 WNL Refill authorized: please advise   Patient is in [place] and will be back in August. She is requesting a 3 month supply until her appointment. She said that she was suppose to so slowly be coming off the HRT but was unable to.

## 2015-09-23 MED ORDER — DROSPIRENONE-ESTRADIOL 0.5-1 MG PO TABS: 1.0000 | ORAL_TABLET | Freq: Every day | ORAL | Status: DC

## 2015-09-23 MED ORDER — DROSPIRENONE-ESTRADIOL 0.25-0.5 MG PO TABS: 1.0000 | ORAL_TABLET | Freq: Every day | ORAL | Status: DC

## 2015-09-23 NOTE — Addendum Note (Signed)
Addended by: Michele Mcalpine on: 09/23/2015 01:58 PM   Modules accepted: Orders, Medications

## 2015-09-23 NOTE — Telephone Encounter (Signed)
Patty has printed Rx.  Rx sent electronically to [facility].   Routing to provider for final review. Patient agreeable to disposition. Will close encounter.

## 2015-09-23 NOTE — Telephone Encounter (Signed)
Patient returned call. She is in [place]. She states she attempted to come off HRT and was unable to due to frequent and intolerable hot flashes.  She has an appointment with Melvia Heaps CNM on 11/13/15 and a mammogram appointment on 11/05/15.  She does not feel she can come off of HRT at this time and wants to talk with provider about this.   Advised will review with covering provider and will call back with any further message.   Patient does not wish to receive return phone call if no problems, she is in [place] and it is 10 pm there.

## 2015-10-22 ENCOUNTER — Other Ambulatory Visit: Payer: Self-pay | Admitting: *Deleted

## 2015-10-22 ENCOUNTER — Encounter: Payer: Self-pay | Admitting: *Deleted

## 2015-10-22 ENCOUNTER — Telehealth: Payer: Self-pay | Admitting: *Deleted

## 2015-10-22 ENCOUNTER — Other Ambulatory Visit: Payer: Self-pay | Admitting: Hematology and Oncology

## 2015-10-22 ENCOUNTER — Telehealth: Payer: Self-pay | Admitting: Hematology and Oncology

## 2015-10-22 NOTE — Telephone Encounter (Signed)
left msg confirming 8/28 apt times

## 2015-10-22 NOTE — Telephone Encounter (Signed)
Pt notified of lab/MD appts on 8/28 @ 1300/1330

## 2015-10-22 NOTE — Telephone Encounter (Signed)
I placed POF

## 2015-10-22 NOTE — Telephone Encounter (Signed)
Pt called to say she will be in the [place] on 8/28, 8/30, 8/31, 9/5, 9/7 and 9/8 if Dr Alvy Bimler wants to do yearly follow up.   Please let RN know when and she will call patient with appt time.

## 2015-11-05 ENCOUNTER — Ambulatory Visit
Admission: RE | Admit: 2015-11-05 | Discharge: 2015-11-05 | Disposition: A | Discharge status: Home / Self Care | Payer: 59 | Source: Ambulatory Visit | Attending: Certified Nurse Midwife | Admitting: Certified Nurse Midwife

## 2015-11-05 DIAGNOSIS — Z1231 Encounter for screening mammogram for malignant neoplasm of breast: Secondary | ICD-10-CM

## 2015-11-13 ENCOUNTER — Encounter: Payer: Self-pay | Admitting: Certified Nurse Midwife

## 2015-11-13 ENCOUNTER — Ambulatory Visit (INDEPENDENT_AMBULATORY_CARE_PROVIDER_SITE_OTHER): Payer: Managed Care, Other (non HMO) | Admitting: Certified Nurse Midwife

## 2015-11-13 VITALS — BP 112/70 | HR 80 | Resp 16 | Ht 64.5 in | Wt 142.0 lb

## 2015-11-13 DIAGNOSIS — R6882 Decreased libido: Secondary | ICD-10-CM

## 2015-11-13 DIAGNOSIS — N951 Menopausal and female climacteric states: Secondary | ICD-10-CM

## 2015-11-13 DIAGNOSIS — Z01419 Encounter for gynecological examination (general) (routine) without abnormal findings: Secondary | ICD-10-CM | POA: Diagnosis not present

## 2015-11-13 DIAGNOSIS — Z0000 Encounter for general adult medical examination without abnormal findings: Secondary | ICD-10-CM

## 2015-11-13 LAB — POCT URINALYSIS DIPSTICK
BILIRUBIN UA: NEGATIVE
Glucose, UA: NEGATIVE
Ketones, UA: NEGATIVE
Leukocytes, UA: NEGATIVE
NITRITE UA: NEGATIVE
PH UA: 7
Protein, UA: NEGATIVE
UROBILINOGEN UA: NEGATIVE

## 2015-11-13 MED ORDER — DROSPIRENONE-ESTRADIOL 0.25-0.5 MG PO TABS: 1.0000 | ORAL_TABLET | Freq: Every day | ORAL | 4 refills | Status: DC

## 2015-11-13 NOTE — Patient Instructions (Signed)

## 2015-11-13 NOTE — Progress Notes (Signed)
60 y.o. [id number] Married  Caucasian Fe here for annual exam. Menopausal on HRT. Denies vaginal bleeding and vaginal dryness. Some decrease libido, but spouse has ED and uses medication, which is hard to work with. Patient working on this with spouse. Both are living and working in [place] year round and only in the [place] for 4 weeks a year to check on family. Son now doing better with self care. Patient had a very difficult time with trying to wean off HRT. Hot flashes returned severely and anxiety, she restarted and has been taking 1/2 tablet. Also has seen PCP for labs/ anxiety medication and now back on HCTZ for borderline hypertension. ( patient feels related to all the salt in the [place] food, improves when in the states). Has been off and on HCTZ over there years for the same issues. Some IBS issues, but controllable. Really plans to wean of HRT this year. Now very settled with family social issues and does not want to continue on due to risks and recommendations. No other health issues today.  Patient's last menstrual period was 08/08/2013.          Sexually active: Yes.    The current method of family planning is post menopausal status.    Exercising: Yes.    walking Smoker:  no  Health Maintenance: Pap:  09/25/14 Neg. HR HPV:neg MMG:  11/06/15 BIRADS1:neg Colonoscopy:  2016 normal  BMD:   10/18/14 Normal  TDaP:  09/2011  Shingles: PCP Pneumonia: PCP Hep C and HIV: PCP Labs: PCP UA: RBC=Trace no symptoms   reports that she has never smoked. She has never used smokeless tobacco. She reports that she drinks about 4.2 - 6.0 oz of alcohol per week . She reports that she does not use drugs.  Past Medical History:  Diagnosis Date  . Hypertension    previous hx  . IBS (irritable bowel syndrome)   . Migraine   . Rectal leakage   . Simple endometrial hyperplasia without atypia 08/18/2013  . Skin cancer 09/30/2004   malignant melanoma.    Past Surgical History:  Procedure Laterality Date  .  CERVICAL CERCLAGE    . CHOLECYSTECTOMY  1980  . ENDOMETRIAL BIOPSY  2010   negative atypia/hyperplasia  . MELANOMA EXCISION  2006  . OTHER SURGICAL HISTORY     full buttock revision  . SHOULDER ARTHROSCOPY     left 2011/right 2012  . WRIST SURGERY     rt wrist    Current Outpatient Prescriptions  Medication Sig Dispense Refill  . ALPRAZolam (XANAX) 0.25 MG tablet Take 0.25 mg by mouth as needed.    Marland Kitchen BEPREVE 1.5 % SOLN 2 (two) times daily as needed.     . clindamycin (CLEOCIN T) 1 % lotion Apply topically 2 (two) times daily as needed.    Marland Kitchen dexlansoprazole (DEXILANT) 60 MG capsule Take 60 mg by mouth daily.    . drospirenone-estradiol (ANGELIQ) 0.5-1 MG tablet Take 1 tablet by mouth daily. 90 tablet 0  . DYMISTA 137-50 MCG/ACT SUSP 2 (two) times daily as needed.     . hydrochlorothiazide (HYDRODIURIL) 25 MG tablet Take 25 mg by mouth daily.    . hyoscyamine (LEVSIN, ANASPAZ) 0.125 MG tablet Take 0.125 mg by mouth as needed.    Marland Kitchen ketoconazole (NIZORAL) 2 % cream 2 (two) times daily as needed.     . montelukast (SINGULAIR) 10 MG tablet daily as needed.     . zolpidem (AMBIEN) 10 MG tablet Take 5  mg by mouth at bedtime as needed for sleep.     No current facility-administered medications for this visit.     Family History  Problem Relation Age of Onset  . Breast cancer Mother   . Cancer Mother     bladder cancer,  precancerous moles.  . Hypertension Mother   . Heart disease Mother   . Cancer Sister     has atypical moles.  . Diabetes Sister   . Scleroderma Sister   . Cancer Father     bladder  . Hypertension Father   . Alcohol abuse Brother     ROS:  Pertinent items are noted in HPI.  Otherwise, a comprehensive ROS was negative.  Exam:   BP 112/70 (BP Location: Right Arm, Patient Position: Sitting, Cuff Size: Normal)   Pulse 80   Resp 16   Ht 5' 4.5" (1.638 m)   Wt 142 lb (64.4 kg)   LMP 08/08/2013   BMI 24.00 kg/m  Height: 5' 4.5" (163.8 cm) Ht Readings from  Last 3 Encounters:  11/13/15 5' 4.5" (1.638 m)  10/23/14 5' 4.5" (1.638 m)  09/25/14 5' 4.5" (1.638 m)    General appearance: alert, cooperative and appears stated age Head: Normocephalic, without obvious abnormality, atraumatic Neck: no adenopathy, supple, symmetrical, trachea midline and thyroid normal to inspection and palpation Lungs: clear to auscultation bilaterally CVAT: negative bilateral Breasts: normal appearance, no masses or tenderness, No nipple retraction or dimpling, No nipple discharge or bleeding, No axillary or supraclavicular adenopathy Heart: regular rate and rhythm Abdomen: soft, non-tender; no masses,  no organomegaly, negative suprapubic Extremities: extremities normal, atraumatic, no cyanosis or edema Skin: Skin color, texture, turgor normal. No rashes or lesions, warm and dry Lymph nodes: Cervical, supraclavicular, and axillary nodes normal. No abnormal inguinal nodes palpated Neurologic: Grossly normal   Pelvic: External genitalia:  no lesions              Urethra:  normal appearing urethra with no masses, tenderness or lesions  Bladder and urethral meatus non tender              Bartholin's and Skene's: normal                 Vagina: normal appearing vagina with normal color and discharge, no lesions              Cervix: no cervical motion tenderness, no lesions and normal appearance              Pap taken: No. Bimanual Exam:  Uterus:  normal size, contour, position, consistency, mobility, non-tender              Adnexa: normal adnexa and no mass, fullness, tenderness               Rectovaginal: Confirms               Anus:  normal sphincter tone, no lesions  Chaperone present: yes  A:  Well Woman with normal exam  Menopausal tried to wean off HRT and needed to  Restart. Has felt much better.  Borderline hypertension with HCTZ use only/ anxiety management with PCP.  Decreased libido, working on .  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  Family  history of breast cancer, mother,late age  P:   Reviewed health and wellness pertinent to exam  Discussed importance of advising if vaginal bleeding. Discussed risks and benefits of HRT and also agree needs to wean off, with being  back on HCTZ again for hypertension. Patient will take every other day for two - 3 weeks and progress with longer intervals slowly to reduce symptoms until weaned off. Will advise per My Chart if having issues.  Rx Angeliq 0.5mg /0.25mg  see order with instructions written prescription given due to mail order.  Continue follow up with PCP as indicated.  Discussed taking time out for date and just being together other than work to see if this helps. Patient has minimal dryness, and uses coconut oil, so suggested having spouse apply, this may help with decrease libido also. Questions addressed. Will advise if further concerns.  Pap smear as above not taken   counseled on breast self exam, mammography screening, stressed importance. Use and side effects of HRT, menopause, adequate intake of calcium and vitamin D, diet and exercise  return annually or prn  An After Visit Summary was printed and given to the patient.

## 2015-11-16 NOTE — Progress Notes (Signed)
Encounter reviewed Cena Bruhn, MD   

## 2015-11-18 ENCOUNTER — Ambulatory Visit (HOSPITAL_BASED_OUTPATIENT_CLINIC_OR_DEPARTMENT_OTHER): Payer: 59 | Admitting: Hematology and Oncology

## 2015-11-18 ENCOUNTER — Telehealth: Payer: Self-pay | Admitting: Hematology and Oncology

## 2015-11-18 ENCOUNTER — Encounter: Payer: Self-pay | Admitting: Hematology and Oncology

## 2015-11-18 ENCOUNTER — Other Ambulatory Visit (HOSPITAL_BASED_OUTPATIENT_CLINIC_OR_DEPARTMENT_OTHER): Payer: 59

## 2015-11-18 DIAGNOSIS — Z85820 Personal history of malignant melanoma of skin: Secondary | ICD-10-CM

## 2015-11-18 DIAGNOSIS — K589 Irritable bowel syndrome without diarrhea: Secondary | ICD-10-CM

## 2015-11-18 DIAGNOSIS — C439 Malignant melanoma of skin, unspecified: Secondary | ICD-10-CM

## 2015-11-18 DIAGNOSIS — I10 Essential (primary) hypertension: Secondary | ICD-10-CM

## 2015-11-18 LAB — CBC WITH DIFFERENTIAL/PLATELET
BASO%: 0.3 % (ref 0.0–2.0)
Basophils Absolute: 0 10*3/uL (ref 0.0–0.1)
EOS ABS: 0.2 10*3/uL (ref 0.0–0.5)
EOS%: 4.1 % (ref 0.0–7.0)
HCT: 37.3 % (ref 34.8–46.6)
HGB: 12.7 g/dL (ref 11.6–15.9)
LYMPH%: 31.6 % (ref 14.0–49.7)
MCH: 29.2 pg (ref 25.1–34.0)
MCHC: 34 g/dL (ref 31.5–36.0)
MCV: 85.7 fL (ref 79.5–101.0)
MONO#: 0.6 10*3/uL (ref 0.1–0.9)
MONO%: 10.2 % (ref 0.0–14.0)
NEUT%: 53.8 % (ref 38.4–76.8)
NEUTROS ABS: 3.2 10*3/uL (ref 1.5–6.5)
Platelets: 245 10*3/uL (ref 145–400)
RBC: 4.35 10*6/uL (ref 3.70–5.45)
RDW: 12.3 % (ref 11.2–14.5)
WBC: 5.9 10*3/uL (ref 3.9–10.3)
lymph#: 1.9 10*3/uL (ref 0.9–3.3)

## 2015-11-18 LAB — COMPREHENSIVE METABOLIC PANEL
ALBUMIN: 3.8 g/dL (ref 3.5–5.0)
ALK PHOS: 27 U/L — AB (ref 40–150)
ALT: 13 U/L (ref 0–55)
AST: 17 U/L (ref 5–34)
Anion Gap: 10 mEq/L (ref 3–11)
BUN: 11.7 mg/dL (ref 7.0–26.0)
CHLORIDE: 95 meq/L — AB (ref 98–109)
CO2: 30 mEq/L — ABNORMAL HIGH (ref 22–29)
Calcium: 9.4 mg/dL (ref 8.4–10.4)
Creatinine: 0.8 mg/dL (ref 0.6–1.1)
EGFR: 86 mL/min/{1.73_m2} — AB (ref >90)
GLUCOSE: 102 mg/dL (ref 70–140)
POTASSIUM: 3.3 meq/L — AB (ref 3.5–5.1)
SODIUM: 135 meq/L — AB (ref 136–145)
Total Bilirubin: <0.3 mg/dL (ref 0.20–1.20)
Total Protein: 6.9 g/dL (ref 6.4–8.3)

## 2015-11-18 LAB — LACTATE DEHYDROGENASE: LDH: 139 U/L (ref 125–245)

## 2015-11-18 NOTE — Telephone Encounter (Signed)
I was able to get Roma Kayser on the phone and have patient speak with her. After Dimas Chyle spoke with patient - per Santiago Glad no appointment is needed at this time. Patient aware.

## 2015-11-18 NOTE — Telephone Encounter (Signed)
No f/u information for Dr. Alvy Bimler in 8/28 los. Left message for Santiago Glad re genetics appointment prior to 9/12

## 2015-11-19 ENCOUNTER — Telehealth: Payer: Self-pay | Admitting: *Deleted

## 2015-11-19 DIAGNOSIS — K589 Irritable bowel syndrome without diarrhea: Secondary | ICD-10-CM | POA: Insufficient documentation

## 2015-11-19 DIAGNOSIS — I10 Essential (primary) hypertension: Secondary | ICD-10-CM | POA: Insufficient documentation

## 2015-11-19 NOTE — Telephone Encounter (Signed)
LVM for pt to call nurse back regarding lab results.

## 2015-11-19 NOTE — Assessment & Plan Note (Signed)
Clinically, she has no signs of disease recurrence. I continue to reinforce the importance of yearly examination by a dermatologist. Reinforced the importance of skin protection. The patient is currently more than 10 years out and is considered a long-term cancer survivor. She received a letter from genetic counselor recently regarding updating her genetic profile. I recommend return visit with a genetic counselor before she returns to [place]. I recommend discharge from the clinic for follow-up with dermatologist only in the future

## 2015-11-19 NOTE — Assessment & Plan Note (Signed)
she will continue current medical management. She has mild hyponatremia and hypokalemia likely related to side effects of HCTZ I recommend close follow-up with primary care doctor for medication adjustment.

## 2015-11-19 NOTE — Telephone Encounter (Signed)
Pt called back and I informed her of lab results and Dr. Calton Dach message.  She verbalized understanding and states she actually just had more labs done at PCP today as well.  She is waiting for those results.

## 2015-11-19 NOTE — Assessment & Plan Note (Signed)
She has return follow-up with gastroenterologist. Recent colonoscopy showed no evidence of disease

## 2015-11-19 NOTE — Telephone Encounter (Signed)
-----   Message from Heath Lark, MD sent at 11/18/2015  3:05 PM EDT ----- Regarding: labs Pls let her know her potassium and sodium a little low today, likely due to HCTZ. Recommend potassium rich diet and possible PCP to follow ----- Message ----- From: Interface, Lab In Three Zero One Sent: 11/18/2015   1:52 PM To: Heath Lark, MD

## 2015-11-19 NOTE — Progress Notes (Signed)
[facility] OFFICE PROGRESS NOTE  Patient Care Team: Marda Stalker, PA-C as PCP - General (Family Medicine)  SUMMARY OF ONCOLOGIC HISTORY:  This is a pleasant lady with excessive sun exposure in the past, had numerous skin lesions removed over the years. In 2006, she was noted to have an abnormal lesion on her upper back which came back melanoma. She underwent a wide local excision followed by sentinel lymph node biopsy which came back negative. Overall staging is T1 A. N0 M0 melanoma. The patient travels a lot but continue to see [place] on a yearly basis and her dermatologist regularly. She resides in [place] for most part of the year. She has been using skin protection and avoiding excessive sun exposure. She has recent colonoscopy which is negative  INTERVAL HISTORY: Please see below for problem oriented charting. She feels well. Denies new skin moles. She saw her dermatologist last month and no abnormal skin lesions were removed. She has recent appointment with GI due to symptoms of irritable bowel syndrome. She was recently started on new blood pressure medications  REVIEW OF SYSTEMS:   Constitutional: Denies fevers, chills or abnormal weight loss Eyes: Denies blurriness of vision Ears, nose, mouth, throat, and face: Denies mucositis or sore throat Respiratory: Denies cough, dyspnea or wheezes Cardiovascular: Denies palpitation, chest discomfort or lower extremity swelling Gastrointestinal:  Denies nausea, heartburn or change in bowel habits Skin: Denies abnormal skin rashes Lymphatics: Denies new lymphadenopathy or easy bruising Neurological:Denies numbness, tingling or new weaknesses Behavioral/Psych: Mood is stable, no new changes  All other systems were reviewed with the patient and are negative.  I have reviewed the past medical history, past surgical history, social history and family history with the patient and they are unchanged from previous  note.  ALLERGIES:  is allergic to latex; sulfa antibiotics; and tegaderm ag mesh [silver].  MEDICATIONS:  Current Outpatient Prescriptions  Medication Sig Dispense Refill  . ALPRAZolam (XANAX) 0.25 MG tablet Take 0.25 mg by mouth as needed.    Marland Kitchen BEPREVE 1.5 % SOLN 2 (two) times daily as needed.     . clindamycin (CLEOCIN T) 1 % lotion Apply topically 2 (two) times daily as needed.    . desoximetasone (TOPICORT) 0.05 % cream Apply 1 application topically 2 (two) times daily.    Marland Kitchen dexlansoprazole (DEXILANT) 60 MG capsule Take 60 mg by mouth daily.    . Drospirenone-Estradiol 0.25-0.5 MG TABS Take 1 tablet by mouth daily. 90 tablet 4  . DYMISTA 137-50 MCG/ACT SUSP 2 (two) times daily as needed.     . hydrochlorothiazide (HYDRODIURIL) 25 MG tablet Take 25 mg by mouth daily.    . hyoscyamine (LEVSIN, ANASPAZ) 0.125 MG tablet Take 0.125 mg by mouth as needed.    Marland Kitchen ketoconazole (NIZORAL) 2 % cream 2 (two) times daily as needed.     . montelukast (SINGULAIR) 10 MG tablet daily as needed.     . zolpidem (AMBIEN) 10 MG tablet Take 5 mg by mouth at bedtime as needed for sleep.     No current facility-administered medications for this visit.     PHYSICAL EXAMINATION: ECOG PERFORMANCE STATUS: 0 - Asymptomatic  Vitals:   11/18/15 1350  BP: (!) 145/83  Pulse: 73  Resp: 18  Temp: 98 F (36.7 C)   Filed Weights   11/18/15 1350  Weight: 145 lb 12.8 oz (66.1 kg)    GENERAL:alert, no distress and comfortable SKIN:Noted skin moles with no suspicious skin lesions  EYES: normal,  Conjunctiva are pink and non-injected, sclera clear OROPHARYNX:no exudate, no erythema and lips, buccal mucosa, and tongue normal  NECK: supple, thyroid normal size, non-tender, without nodularity LYMPH:  no palpable lymphadenopathy in the cervical, axillary or inguinal LUNGS: clear to auscultation and percussion with normal breathing effort HEART: regular rate & rhythm and no murmurs and no lower extremity  edema ABDOMEN:abdomen soft, non-tender and normal bowel sounds Musculoskeletal:no cyanosis of digits and no clubbing  NEURO: alert & oriented x 3 with fluent speech, no focal motor/sensory deficits  LABORATORY DATA:  I have reviewed the data as listed    Component Value Date/Time   NA 135 (L) 11/18/2015 1340   K 3.3 (L) 11/18/2015 1340   CL 102 10/29/2011 0939   CO2 30 (H) 11/18/2015 1340   GLUCOSE 102 11/18/2015 1340   BUN 11.7 11/18/2015 1340   CREATININE 0.8 11/18/2015 1340   CALCIUM 9.4 11/18/2015 1340   PROT 6.9 11/18/2015 1340   ALBUMIN 3.8 11/18/2015 1340   AST 17 11/18/2015 1340   ALT 13 11/18/2015 1340   ALKPHOS 27 (L) 11/18/2015 1340   BILITOT <0.30 11/18/2015 1340   GFRNONAA >60 01/30/2009 1040   GFRAA  01/30/2009 1040    >60        The eGFR has been calculated using the MDRD equation. This calculation has not been validated in all clinical situations. eGFR's persistently <60 mL/min signify possible Chronic Kidney Disease.    No results found for: SPEP, UPEP  Lab Results  Component Value Date   WBC 5.9 11/18/2015   NEUTROABS 3.2 11/18/2015   HGB 12.7 11/18/2015   HCT 37.3 11/18/2015   MCV 85.7 11/18/2015   PLT 245 11/18/2015      Chemistry      Component Value Date/Time   NA 135 (L) 11/18/2015 1340   K 3.3 (L) 11/18/2015 1340   CL 102 10/29/2011 0939   CO2 30 (H) 11/18/2015 1340   BUN 11.7 11/18/2015 1340   CREATININE 0.8 11/18/2015 1340      Component Value Date/Time   CALCIUM 9.4 11/18/2015 1340   ALKPHOS 27 (L) 11/18/2015 1340   AST 17 11/18/2015 1340   ALT 13 11/18/2015 1340   BILITOT <0.30 11/18/2015 1340      ASSESSMENT & PLAN:  History of melanoma Clinically, she has no signs of disease recurrence. I continue to reinforce the importance of yearly examination by a dermatologist. Reinforced the importance of skin protection. The patient is currently more than 10 years out and is considered a long-term cancer survivor. She  received a letter from genetic counselor recently regarding updating her genetic profile. I recommend return visit with a genetic counselor before she returns to [place]. I recommend discharge from the clinic for follow-up with dermatologist only in the future  IBS (irritable bowel syndrome) She has return follow-up with gastroenterologist. Recent colonoscopy showed no evidence of disease  Essential hypertension she will continue current medical management. She has mild hyponatremia and hypokalemia likely related to side effects of HCTZ I recommend close follow-up with primary care doctor for medication adjustment.    No orders of the defined types were placed in this encounter.  All questions were answered. The patient knows to call the clinic with any problems, questions or concerns. No barriers to learning was detected. I spent 15 minutes counseling the patient face to face. The total time spent in the appointment was 20 minutes and more than 50% was on counseling and review of test results  [facility], [place], MD 11/19/2015 7:39 AM

## 2015-11-20 ENCOUNTER — Other Ambulatory Visit: Payer: Self-pay | Admitting: Gastroenterology

## 2015-11-20 DIAGNOSIS — R1032 Left lower quadrant pain: Secondary | ICD-10-CM

## 2015-11-22 ENCOUNTER — Other Ambulatory Visit (HOSPITAL_COMMUNITY): Payer: Self-pay | Admitting: Gastroenterology

## 2015-11-22 DIAGNOSIS — R1032 Left lower quadrant pain: Secondary | ICD-10-CM

## 2015-11-26 ENCOUNTER — Ambulatory Visit (HOSPITAL_COMMUNITY)
Admission: RE | Admit: 2015-11-26 | Discharge: 2015-11-26 | Disposition: A | Discharge status: Home / Self Care | Payer: Managed Care, Other (non HMO) | Source: Ambulatory Visit | Attending: Gastroenterology | Admitting: Gastroenterology

## 2015-11-26 ENCOUNTER — Encounter (HOSPITAL_COMMUNITY): Payer: Self-pay

## 2015-11-26 DIAGNOSIS — I700 Atherosclerosis of aorta: Secondary | ICD-10-CM | POA: Insufficient documentation

## 2015-11-26 DIAGNOSIS — R1032 Left lower quadrant pain: Secondary | ICD-10-CM | POA: Diagnosis not present

## 2015-11-26 MED ORDER — IOPAMIDOL (ISOVUE-300) INJECTION 61%
100.0000 mL | Freq: Once | INTRAVENOUS | Status: AC | PRN
  Administered 2015-11-26: 100 mL via INTRAVENOUS

## 2015-11-29 ENCOUNTER — Other Ambulatory Visit: Payer: 59

## 2016-06-21 HISTORY — PX: OTHER SURGICAL HISTORY: SHX169

## 2016-06-23 HISTORY — PX: TRANSTHORACIC ECHOCARDIOGRAM: SHX275

## 2016-08-27 ENCOUNTER — Other Ambulatory Visit: Payer: Self-pay | Admitting: Certified Nurse Midwife

## 2016-08-27 DIAGNOSIS — Z1231 Encounter for screening mammogram for malignant neoplasm of breast: Secondary | ICD-10-CM

## 2016-09-01 ENCOUNTER — Ambulatory Visit
Admission: RE | Admit: 2016-09-01 | Discharge: 2016-09-01 | Disposition: A | Discharge status: Home / Self Care | Payer: Managed Care, Other (non HMO) | Source: Ambulatory Visit | Attending: Certified Nurse Midwife | Admitting: Certified Nurse Midwife

## 2016-09-01 DIAGNOSIS — Z1231 Encounter for screening mammogram for malignant neoplasm of breast: Secondary | ICD-10-CM

## 2016-09-02 ENCOUNTER — Telehealth: Payer: Self-pay | Admitting: Cardiology

## 2016-09-02 NOTE — Telephone Encounter (Signed)
Received records from [facility] for appointment on 09/16/16 with Dr Ellyn Hack.  Records put with Dr Allison Quarry schedule for 09/16/16. lp

## 2016-09-11 ENCOUNTER — Ambulatory Visit: Payer: Managed Care, Other (non HMO) | Admitting: Cardiology

## 2016-09-16 ENCOUNTER — Encounter: Payer: Self-pay | Admitting: Cardiology

## 2016-09-16 ENCOUNTER — Ambulatory Visit (INDEPENDENT_AMBULATORY_CARE_PROVIDER_SITE_OTHER): Payer: Managed Care, Other (non HMO) | Admitting: Cardiology

## 2016-09-16 VITALS — BP 112/72 | HR 67 | Ht 64.5 in | Wt 158.8 lb

## 2016-09-16 DIAGNOSIS — I491 Atrial premature depolarization: Secondary | ICD-10-CM | POA: Insufficient documentation

## 2016-09-16 DIAGNOSIS — I10 Essential (primary) hypertension: Secondary | ICD-10-CM | POA: Diagnosis not present

## 2016-09-16 DIAGNOSIS — I493 Ventricular premature depolarization: Secondary | ICD-10-CM | POA: Diagnosis not present

## 2016-09-16 MED ORDER — METOPROLOL TARTRATE 25 MG PO TABS: 25.0000 mg | ORAL_TABLET | Freq: Two times a day (BID) | ORAL | 3 refills | Status: DC

## 2016-09-16 MED ORDER — METOPROLOL TARTRATE 25 MG PO TABS: 25.0000 mg | ORAL_TABLET | Freq: Two times a day (BID) | ORAL | 6 refills | Status: DC

## 2016-09-16 NOTE — Patient Instructions (Signed)
Medication   Stop Losartan  Increase METOPROLOL TARTRATE 25 MG ONE TABLET TWICE A DAY. IF YOU HAVE A BAD SPELL YOU MAY TAKE AN EXTRA DOSE ,ADDITIONALLY     Your physician recommends that you schedule a follow-up appointment in [facility] 2018 WITH DR [place], [place] TO [place].

## 2016-09-16 NOTE — Progress Notes (Signed)
PCP: Marda Stalker, PA-C  Clinic Note: Chief Complaint  Patient presents with  . New Evaluation    Palpitations/PACs, aortic atherosclerosis    HPI: Kristina Delgado is a 61 y.o. female who is being seen today for the evaluation of palpitations and PACs at the request of Marda Stalker, PA-C. - She apparently had seen a cardiologist in [place].  Recent Hospitalizations: NONE  Studies Personally Reviewed - (if available, images/films reviewed: From Epic Chart or Care Everywhere)  24-hour Holter monitor April 2018: ([facility], [place]) close to 119,000 beats. Minimum heart rate 66, average 84, maximum 143 of sinus rhythm. No PVCs noted no VT noted. Just over 16,000 supraventricular contractions/PACs noted. 3 episodes of greater than 10 beats paroxysmal atrial tachycardia (PAT - with max rate of 170 bpm). --> Higher incidence of PACs noted in 19 hours.  Transthoracic echocardiogram April 2018: [facility] ([place]): Normal LV size and function. Hyperdynamic LV function (has EF of 82% recorded) mild aortic regurgitation and mild mitral regurgitation. Mild/borderline grade 1 diastolic dysfunction. Otherwise normal wall thickness.  CT Abd-Pelvis w/ Contrast: Aortic and branch vessel atherosclerosis   Interval History: Kristina Delgado comes in today just for a second opinion cardiologist here in the [place]. She usually lives in [place] with her husband who works as a Chief Strategy Officer for a company that does dizziness with the [employer]. 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 She has had intermittent episodes of palpitations going on for years, but they've gotten worse of late. She's also had blood pressure issues for several years but this March her blood pressures went up. She been on HCTZ and then in March was started on Cozaar. Shortly after that she really started noticing a lot of palpitations and fluttering in her chest. She was evaluated with an  echocardiogram and a monitor there. The echocardiogram is essentially normal with some mild AI and MR. The monitor showed quite frequent PACs with a few runs of 10-13 beats of PAT. She was then started on metoprolol. Since doing so, her palpitations have improved but she is only taking very low dose. She is also noticing that her blood pressure is gone down into the 1 teens from as high as 150s before starting the Cozaar. With that she is getting a little bit dizzy and lightheaded.  The episodes usually occur at rest or at night. They're not associated with exertion. Denies associated with chest tightness or pressure, just an unusual sensation in her chest - fluttering.  If prolonged, she feels a little bit short of breath.  Otherwise with her palpitations she has not had that many since starting the metoprolol and the last 2 weeks she's had none. When she does have the warfarin rapid episodes she feels quite fatigued afterwards and that she's been exerting herself. She feels a little bit unusual sensation in her chest, no chest pain or pressure. She has not had any syncope or near syncope, TIA or amaurosis fugax symptoms. Just a bit fatigued. No chest pain or shortness of breath with rest or exertion. No PND, orthopnea or edema.   She never had blood pressure high enough to have any headaches or blurred vision.  ROS: A comprehensive was performed. Review of Systems  Constitutional: Negative for malaise/fatigue and weight loss.  HENT: Negative for congestion and nosebleeds.   Respiratory: Negative for cough, shortness of breath and wheezing.   Cardiovascular: Negative for claudication.  Gastrointestinal: Negative for blood in stool,  heartburn and melena.  Genitourinary: Negative for hematuria.  Musculoskeletal: Negative for falls and myalgias.  Neurological: Positive for dizziness (If she hasn't prolonged rapid heartbeats but).  Psychiatric/Behavioral: Negative for memory loss. The patient has  insomnia. The patient is not nervous/anxious.     I have reviewed and (if needed) personally updated the patient's problem list, medications, allergies, past medical and surgical history, social and family history.   Past Medical History:  Diagnosis Date  . Abnormal Pap smear of cervix    yrs ago  . GERD (gastroesophageal reflux disease)    Followed by Dr. Oletta Delgado. Plan to repeat EGD: In 2021  . Heart palpitations   . Hyperlipidemia   . Hypertension    previous hx  . IBS (irritable bowel syndrome)   . Malignant melanoma (Kristina Delgado) 2006   History  . Migraine   . Rectal leakage   . Simple endometrial hyperplasia without atypia 08/18/2013  . Skin cancer 09/30/2004   malignant melanoma.    Past Surgical History:  Procedure Laterality Date  . CERVICAL CERCLAGE    . CHOLECYSTECTOMY  1980  . ENDOMETRIAL BIOPSY  2010   negative atypia/hyperplasia  . Holter Monitor  06/2016   24 - ([facility], [place]) close to 119,000 beats. Minimum heart rate 66, average 84, maximum 143 of sinus rhythm. No PVCs noted no VT noted. Just over 16,000 supraventricular contractions/PACs noted. 3 episodes of greater than 10 beats paroxysmal atrial tachycardia (PAT - with max rate of 170 bpm). --> Higher incidence of PACs noted in 19 hours.  Marland Kitchen MELANOMA EXCISION  2006  . OTHER SURGICAL HISTORY     full buttock revision  . SHOULDER ARTHROSCOPY     left 2011/right 2012  . TRANSTHORACIC ECHOCARDIOGRAM  06/23/2016   [facility] ([place]): Normal LV size and function. Hyperdynamic LV function (has EF of 82% recorded) mild aortic regurgitation and mild mitral regurgitation. Mild/borderline grade 1 diastolic dysfunction. Otherwise normal wall thickness.  . WRIST SURGERY     rt wrist    Current Meds  Medication Sig  . ALPRAZolam (XANAX) 0.25 MG tablet Take 0.25 mg by mouth as needed.  Marland Kitchen BEPREVE 1.5 % SOLN 2 (two) times daily as needed.   . desoximetasone (TOPICORT) 0.05 % cream Apply 1 application  topically 2 (two) times daily.  Marland Kitchen dexlansoprazole (DEXILANT) 60 MG capsule Take 60 mg by mouth daily.  Marland Kitchen DYMISTA 137-50 MCG/ACT SUSP 2 (two) times daily as needed.   Marland Kitchen glycopyrrolate (ROBINUL) 2 MG tablet Take 1 tablet by mouth daily.  . hydrochlorothiazide (HYDRODIURIL) 25 MG tablet Take 25 mg by mouth daily.  . hyoscyamine (LEVSIN, ANASPAZ) 0.125 MG tablet Take 0.125 mg by mouth as needed.  Marland Kitchen ketoconazole (NIZORAL) 2 % cream 2 (two) times daily as needed.   . metoprolol tartrate (LOPRESSOR) 25 MG tablet Take 1 tablet (25 mg total) by mouth 2 (two) times daily.  . montelukast (SINGULAIR) 10 MG tablet daily as needed.   . pravastatin (PRAVACHOL) 40 MG tablet Take 40 mg by mouth daily.  Marland Kitchen zolpidem (AMBIEN) 10 MG tablet Take 5 mg by mouth at bedtime as needed for sleep.  . [DISCONTINUED] losartan (COZAAR) 25 MG tablet Take 25 mg by mouth daily.  . [DISCONTINUED] metoprolol tartrate (LOPRESSOR) 25 MG tablet Take 25 mg by mouth 2 (two) times daily.    Allergies  Allergen Reactions  . Latex     Excoriated skin  -   Tegaderm.  . Sulfa Antibiotics Hives  .  Shrimp [Shellfish Allergy] Other (See Comments)  . Tegaderm Ag Mesh [Silver] Dermatitis    Social History   Social History  . Marital status: Married    Spouse name: N/A  . Number of children: 2  . Years of education: N/A   Social History Main Topics  . Smoking status: Never Smoker  . Smokeless tobacco: Never Used  . Alcohol use 4.2 - 6.0 oz/week    7 - 10 Glasses of wine per week  . Drug use: No     Comment: As needed Xanax for flying, as needed Ambien for sleeping. Usually for travel  . Sexual activity: Yes    Partners: Male     Comment: husband vasectomy   Other Topics Concern  . None   Social History Narrative   She is a married mother of 2 children, no grandchildren. 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 She lives with her husband who is working on site in [place]. She has a BS/BSN.   She enjoys walking at least 50  minutes to an hour to 3 days a week. When she doesn't do it is because either too hot, too cold or she just feels too lazy.    family history includes Alcohol abuse in her brother; Bipolar disorder in her father; Breast cancer in her mother; Cancer in her father, mother, and sister; Coronary artery disease (age of onset: 77) in her mother; Dementia in her father; Diabetes in her sister; Hypercholesterolemia in her sister; Hyperlipidemia in her mother; Hypertension in her father and mother; Leukemia (age of onset: 31) in her maternal grandmother; Scleroderma in her sister.  Wt Readings from Last 3 Encounters:  09/17/16 158 lb (71.7 kg)  09/16/16 158 lb 12.8 oz (72 kg)  11/18/15 145 lb 12.8 oz (66.1 kg)    PHYSICAL EXAM BP 112/72   Pulse 67   Ht 5' 4.5" (1.638 m)   Wt 158 lb 12.8 oz (72 kg)   LMP 08/08/2013   BMI 26.84 kg/m  General appearance: alert, cooperative, appears stated age, no distress.Well-nourished, well-groomed HEENT: [place]/AT, EOMI, MMM, anicteric sclera Neck: no adenopathy, no carotid bruit and no JVD Lungs: clear to auscultation bilaterally, normal percussion bilaterally and non-labored Heart: regular rate and rhythm, S1 &S2 normal, no murmur, click, rub or gallop; nondisplaced PMI Abdomen: soft, non-tender; bowel sounds normal; no masses,  no organomegaly; No HJR Extremities: extremities normal, atraumatic, no cyanosis, and edema - trivial Pulses: 2+ and symmetric;  Skin: mobility and turgor normal, no evidence of bleeding or bruising and no lesions noted  Neurologic: Mental status: Alert & oriented x 3, thought content appropriate; non-focal exam.  Pleasant mood & affect. Cranial nerves: normal (II-XII grossly intact)    Adult ECG Report  Rate: 67 ;  Rhythm: normal sinus rhythm and Otherwise normal axis, intervals and durations;   Narrative Interpretation: Normal EKG  Other studies Reviewed: Additional studies/ records that were reviewed today include:  Recent Labs:  11/19/2015  Na+ 136, K+ 4.1, Cl- 96, HCO3- 31 , BUN 12, Cr 0.74, Glu 104, Ca2+ 9.8; AST 16, ALT, 15, AlkP 22  TC 280, TG 196, HDL 67, LDL 174   ASSESSMENT / PLAN: Problem List Items Addressed This Visit    Essential hypertension - Primary (Chronic)    Pretty well-controlled blood pressures according to her reports. Would like to try to make it easier for her however. She is on 3 low-dose medications. I think for now we will try to stop losartan  in lieu of using metoprolol at a higher dose to reduce or palpitations.  Plan: Stop losartan, increase metoprolol to 25 twice a day. Continue HCTZ, but monitor potassium and sodium levels.      Relevant Medications   pravastatin (PRAVACHOL) 40 MG tablet   metoprolol tartrate (LOPRESSOR) 25 MG tablet   Finding of multiple premature atrial contractions by electrocardiography (Chronic)    Frequent PACs noted on her monitor with short bursts of PAT. At this point I think we need to titrate up her beta blocker dose in order to reduce her PACs while at the same time stopping her losartan to avoid hypotension. She can increase her metoprolol 25 twice a day and then use an additional one half to full tablet for when necessary spells. We also talked about vagal maneuvers.      Relevant Medications   pravastatin (PRAVACHOL) 40 MG tablet   metoprolol tartrate (LOPRESSOR) 25 MG tablet      Current medicines are reviewed at length with the patient today. (+/- concerns) n/a The following changes have been made: n/a  Patient Instructions  Medication   Stop Losartan  Increase METOPROLOL TARTRATE 25 MG ONE TABLET TWICE A DAY. IF YOU HAVE A BAD SPELL YOU MAY TAKE AN EXTRA DOSE ,ADDITIONALLY     Your physician recommends that you schedule a follow-up appointment in [place] 2018 WITH DR [place], [place].    Studies Ordered:   No orders of the defined types were placed in this encounter.     Glenetta Hew, M.D.,  M.S. Interventional Cardiologist   Pager # [phone] Phone # [phone] [address] [place], [place] [address]

## 2016-09-17 ENCOUNTER — Other Ambulatory Visit (HOSPITAL_COMMUNITY)
Admission: RE | Admit: 2016-09-17 | Discharge: 2016-09-17 | Disposition: A | Discharge status: Home / Self Care | Payer: Managed Care, Other (non HMO) | Source: Ambulatory Visit | Attending: Obstetrics & Gynecology | Admitting: Obstetrics & Gynecology

## 2016-09-17 ENCOUNTER — Encounter: Payer: Self-pay | Admitting: Certified Nurse Midwife

## 2016-09-17 ENCOUNTER — Ambulatory Visit (INDEPENDENT_AMBULATORY_CARE_PROVIDER_SITE_OTHER): Payer: Managed Care, Other (non HMO) | Admitting: Certified Nurse Midwife

## 2016-09-17 VITALS — BP 116/80 | HR 70 | Resp 16 | Ht 64.5 in | Wt 158.0 lb

## 2016-09-17 DIAGNOSIS — Z01419 Encounter for gynecological examination (general) (routine) without abnormal findings: Secondary | ICD-10-CM | POA: Insufficient documentation

## 2016-09-17 DIAGNOSIS — Z0000 Encounter for general adult medical examination without abnormal findings: Secondary | ICD-10-CM

## 2016-09-17 DIAGNOSIS — Z124 Encounter for screening for malignant neoplasm of cervix: Secondary | ICD-10-CM | POA: Diagnosis not present

## 2016-09-17 DIAGNOSIS — Z803 Family history of malignant neoplasm of breast: Secondary | ICD-10-CM | POA: Diagnosis not present

## 2016-09-17 LAB — POCT URINALYSIS DIPSTICK
Bilirubin, UA: NEGATIVE
GLUCOSE UA: NEGATIVE
KETONES UA: NEGATIVE
LEUKOCYTES UA: NEGATIVE
Nitrite, UA: NEGATIVE
PROTEIN UA: NEGATIVE
Urobilinogen, UA: NEGATIVE E.U./dL — AB
pH, UA: 5 (ref 5.0–8.0)

## 2016-09-17 NOTE — Patient Instructions (Signed)

## 2016-09-17 NOTE — Progress Notes (Signed)
61 y.o. [id number] Married  Caucasian Fe here for annual exam. Recent evaluation for PAC's and saw Cardiology and put on Toprol with increase dose. Change in hypertension medication to just HCTZ and doing well. Continues follow up with PCP and cardiology while here with all labs... Weaned self of HRT 3 months ago. Some hot flashes, but tolerable. [place] the [place] now after working in [place]. Will be returning to [place] in the about one month. No other health concerns today. Son getting married!  Patient's last menstrual period was 08/08/2013.          Sexually active: Yes.    The current method of family planning is vasectomy.    Exercising: Yes.    walking Smoker:  no  Health Maintenance: Pap:  09-25-14 neg HPV HR neg History of Abnormal Pap: yes, years ago MMG:  09-01-16 category c density birads 1:neg Self Breast exams: no Colonoscopy:  2016 normal BMD:   2016 normal TDaP:  2013 Shingles: had done Pneumonia: had done Hep C and HIV: hep c neg 2017 Labs: poct urine-tr   reports that she has never smoked. She has never used smokeless tobacco. She reports that she drinks about 4.2 - 6.0 oz of alcohol per week . She reports that she does not use drugs.  Past Medical History:  Diagnosis Date  . Heart palpitations   . Hyperlipidemia   . Hypertension    previous hx  . IBS (irritable bowel syndrome)   . Migraine   . Rectal leakage   . Simple endometrial hyperplasia without atypia 08/18/2013  . Skin cancer 09/30/2004   malignant melanoma.    Past Surgical History:  Procedure Laterality Date  . CERVICAL CERCLAGE    . CHOLECYSTECTOMY  1980  . ENDOMETRIAL BIOPSY  2010   negative atypia/hyperplasia  . MELANOMA EXCISION  2006  . OTHER SURGICAL HISTORY     full buttock revision  . SHOULDER ARTHROSCOPY     left 2011/right 2012  . WRIST SURGERY     rt wrist    Current Outpatient Prescriptions  Medication Sig Dispense Refill  . ALPRAZolam (XANAX) 0.25 MG tablet  Take 0.25 mg by mouth as needed.    Marland Kitchen BEPREVE 1.5 % SOLN 2 (two) times daily as needed.     . desoximetasone (TOPICORT) 0.05 % cream Apply 1 application topically 2 (two) times daily.    Marland Kitchen dexlansoprazole (DEXILANT) 60 MG capsule Take 60 mg by mouth daily.    Marland Kitchen DYMISTA 137-50 MCG/ACT SUSP 2 (two) times daily as needed.     Marland Kitchen glycopyrrolate (ROBINUL) 2 MG tablet Take 1 tablet by mouth daily.    . hydrochlorothiazide (HYDRODIURIL) 25 MG tablet Take 25 mg by mouth daily.    . hyoscyamine (LEVSIN, ANASPAZ) 0.125 MG tablet Take 0.125 mg by mouth as needed.    Marland Kitchen ketoconazole (NIZORAL) 2 % cream 2 (two) times daily as needed.     . metoprolol tartrate (LOPRESSOR) 25 MG tablet Take 1 tablet (25 mg total) by mouth 2 (two) times daily. 60 tablet 6  . montelukast (SINGULAIR) 10 MG tablet daily as needed.     . pravastatin (PRAVACHOL) 40 MG tablet Take 40 mg by mouth daily.    Marland Kitchen zolpidem (AMBIEN) 10 MG tablet Take 5 mg by mouth at bedtime as needed for sleep.     No current facility-administered medications for this visit.     Family History  Problem Relation Age of Onset  .  Breast cancer Mother   . Cancer Mother        bladder cancer,  precancerous moles.  . Hypertension Mother   . Heart disease Mother   . Cancer Sister        has atypical moles.  . Diabetes Sister   . Scleroderma Sister   . Cancer Father        bladder  . Hypertension Father   . Bipolar disorder Father   . Dementia Father   . Alcohol abuse Brother     ROS:  Pertinent items are noted in HPI.  Otherwise, a comprehensive ROS was negative.  Exam:   BP 116/80   Pulse 70   Resp 16   Ht 5' 4.5" (1.638 m)   Wt 158 lb (71.7 kg)   LMP 08/08/2013   BMI 26.70 kg/m  Height: 5' 4.5" (163.8 cm) Ht Readings from Last 3 Encounters:  09/17/16 5' 4.5" (1.638 m)  09/16/16 5' 4.5" (1.638 m)  11/13/15 5' 4.5" (1.638 m)    General appearance: alert, cooperative and appears stated age Head: Normocephalic, without obvious  abnormality, atraumatic Neck: no adenopathy, supple, symmetrical, trachea midline and thyroid normal to inspection and palpation Lungs: clear to auscultation bilaterally Breasts: normal appearance, no masses or tenderness, No nipple retraction or dimpling, No nipple discharge or bleeding, No axillary or supraclavicular adenopathy Heart: regular rate and rhythm Abdomen: soft, non-tender; no masses,  no organomegaly Extremities: extremities normal, atraumatic, no cyanosis or edema Skin: Skin color, texture, turgor normal. No rashes or lesions Lymph nodes: Cervical, supraclavicular, and axillary nodes normal. No abnormal inguinal nodes palpated Neurologic: Grossly normal   Pelvic: External genitalia:  no lesions              Urethra:  normal appearing urethra with no masses, tenderness or lesions              Bartholin's and Skene's: normal                 Vagina:atrophic appearing vagina with normal color and discharge, no lesions              Cervix: multiparous appearance, no cervical motion tenderness and no lesions              Pap taken: Yes.   Bimanual Exam:  Uterus:  normal size, contour, position, consistency, mobility, non-tender and anteverted              Adnexa: normal adnexa and no mass, fullness, tenderness               Rectovaginal: Confirms               Anus:  normal sphincter tone, no lesions  Chaperone present: yes  A:  Well Woman with normal exam  Menopausal no HRT now stopped in 3/18  Vaginal atrophy using OTC product  Recent diagnosis of PAC's and on medication with Cardiology now  Hypertension management with PCP with HCTZ only now  ? Depression with [place]  History of Melanoma has skin check prn  Family history of breast cancer mother has been aware of genetic screening and declines.  P:   Reviewed health and wellness pertinent to exam  Aware of need to advise if vaginal bleeding  Discussed coconut oil use with instructions to try, will  advise if no change  Continue follow up with MD regarding heart and Hypertension  Discussed possible depression with living status, and may  need counseling or change in time away. Previously was in [place] 3 times per year, not one. Patient agrees she needs to change this and plans to work on.  Discussed importance of skin check with previous history.  Stressed SBE and yearly mammogram with 3 D.  Pap smear: yes  counseled on breast self exam, mammography screening, feminine hygiene, adequate intake of calcium and vitamin D, diet and exercise  return annually or prn  An After Visit Summary was printed and given to the patient.

## 2016-09-19 ENCOUNTER — Encounter: Payer: Self-pay | Admitting: Cardiology

## 2016-09-19 NOTE — Assessment & Plan Note (Signed)
Pretty well-controlled blood pressures according to her reports. Would like to try to make it easier for her however. She is on 3 low-dose medications. I think for now we will try to stop losartan in lieu of using metoprolol at a higher dose to reduce or palpitations.  Plan: Stop losartan, increase metoprolol to 25 twice a day. Continue HCTZ, but monitor potassium and sodium levels.

## 2016-09-19 NOTE — Assessment & Plan Note (Signed)
Frequent PACs noted on her monitor with short bursts of PAT. At this point I think we need to titrate up her beta blocker dose in order to reduce her PACs while at the same time stopping her losartan to avoid hypotension. She can increase her metoprolol 25 twice a day and then use an additional one half to full tablet for when necessary spells. We also talked about vagal maneuvers.

## 2016-09-21 LAB — CYTOLOGY - PAP: Diagnosis: NEGATIVE

## 2016-09-21 NOTE — Addendum Note (Signed)
Addended by: Zebedee Iba on: 09/21/2016 08:39 AM   Modules accepted: Orders

## 2016-09-22 ENCOUNTER — Telehealth: Payer: Self-pay | Admitting: Cardiology

## 2016-09-22 NOTE — Telephone Encounter (Signed)
Pt wanted you to call her,she did not give any information.

## 2016-09-22 NOTE — Telephone Encounter (Signed)
SPOKE TO patient. SHE STATES THE DOCTOR INFORMED HER SHE COULD OBTAIN  09/16/16 OFFICE NOTES FROM [facility].  RN INFORMED PATIENT THAT WAS NOT POSSIBLE ,BUT SHE CAN COME TO OFFICE TO PICK UP A COPY AND SIGN MEDICAL RELEASE PATIENT STATES SHE WOULD COME TODAY RN GAVE PATIENT INSTRUCTION OF WHAT TO DO. SHE VERBALIZED UNDERSTANDING.

## 2017-01-20 ENCOUNTER — Ambulatory Visit: Payer: Managed Care, Other (non HMO) | Admitting: Cardiology

## 2017-01-27 ENCOUNTER — Ambulatory Visit (INDEPENDENT_AMBULATORY_CARE_PROVIDER_SITE_OTHER): Payer: Managed Care, Other (non HMO) | Admitting: Cardiology

## 2017-01-27 ENCOUNTER — Encounter: Payer: Self-pay | Admitting: Cardiology

## 2017-01-27 VITALS — BP 132/79 | HR 66 | Ht 64.5 in | Wt 147.6 lb

## 2017-01-27 DIAGNOSIS — I10 Essential (primary) hypertension: Secondary | ICD-10-CM | POA: Diagnosis not present

## 2017-01-27 DIAGNOSIS — E782 Mixed hyperlipidemia: Secondary | ICD-10-CM | POA: Insufficient documentation

## 2017-01-27 DIAGNOSIS — Z8249 Family history of ischemic heart disease and other diseases of the circulatory system: Secondary | ICD-10-CM | POA: Diagnosis not present

## 2017-01-27 DIAGNOSIS — I491 Atrial premature depolarization: Secondary | ICD-10-CM

## 2017-01-27 MED ORDER — METOPROLOL TARTRATE 25 MG PO TABS: ORAL_TABLET | ORAL | 11 refills | Status: DC

## 2017-01-27 MED ORDER — HYDROCHLOROTHIAZIDE 25 MG PO TABS: 25.0000 mg | ORAL_TABLET | ORAL | 11 refills | Status: DC | PRN

## 2017-01-27 MED ORDER — METOPROLOL TARTRATE 25 MG PO TABS: 25.0000 mg | ORAL_TABLET | Freq: Two times a day (BID) | ORAL | 11 refills | Status: DC

## 2017-01-27 NOTE — Assessment & Plan Note (Signed)
She is somewhat concerned given her family history of premature coronary disease having hyperlipidemia and hypertension about knowing her baseline risk.  Especially with her being in [place].  We talked about screening for baseline risk.  I think probably the best choice since she is already on a statin would be to check coronary calcium score for risk stratification.  Plan: Coronary calcium score.

## 2017-01-27 NOTE — Assessment & Plan Note (Signed)
Much better lipid control since having started pravastatin.

## 2017-01-27 NOTE — Assessment & Plan Note (Signed)
Very well controlled currently.  She did fine being off losartan and I honestly think we can actually stop the HCTZ since she is not really having any edema anymore.  I recommend she just use HCTZ as a as needed.  This will allow me to use more beta-blocker.

## 2017-01-27 NOTE — Progress Notes (Signed)
PCP: Marda Stalker, PA-C  Clinic Note: Chief Complaint  Patient presents with  . Follow-up  . Palpitations    HPI: Kristina Delgado is a 61 y.o. female who is being seen today for follow-up evaluation of palpitations and PACs at the request of Marda Stalker, PA-C. - She apparently had seen a cardiologist in [place] --she and her husband live in [place] where he works for the [employer] I last saw her in June of this year.  This was for a second opinion for palpitations.  We increased metoprolol to 25 mg twice daily with occasional as needed doses.  Recent Hospitalizations: NONE  Studies Personally Reviewed - (if available, images/films reviewed: From Epic Chart or Care Everywhere)  No new studies   Interval History: Kristina Delgado comes in today somewhat stressed about her travel back from [place].  She is here for 6weeks, and is somewhat fatigued from her traveling.  She says that since we last adjusted her metoprolol, she has noted a change in the frequency of palpitations and the duration, but they are still present.  They are just not happening for as long or as much.  Usually they have been happening maybe 3 or 4 times a month, but during the week of travel, they have been happening more like nightly.  Very infrequently delay happened 2 nights in a row.  The symptoms are usually happening when she is at rest and lying down at night, and not during the day.  She denies any just occasionally feels dizzy when these episodes happen.  They are lasting less than a minute or 2 now. She denies any syncope/near syncope or TIAs or amaurosis fugax.  She does have some mild fatigue but that seems to be getting better.  But she does note though is climbing up an incline or lots of steps she will noticed that her legs get tired and fatigued.  It is important to note that she just had bilateral vein ablation/sclerotherapy and is wearing thigh-high support stockings.  These make her legs feel much better.  She  also has noted that her swelling is notably improved.  Other review of cardiac symptoms: No chest tightness or pressure at rest or exertion.  No resting or exertional dyspnea.  No PND orthopnea.  Overall energy is relatively preserved on beta-blocker.  She brings with her recording of her blood pressure since we adjusted her medications having stopped losartan and increase metoprolol 25 mg twice daily.  For the most part her blood pressures have been in the 563-149 mmHg systolic and heart rates have been usually in the 50s and 60s with some readings in the 110s.  There were a couple low systolic pressures in the 90s but for the most part they have been relatively well controlled.  She has noted that she has lost about 13-15 pounds.  ROS: A comprehensive was performed --  Pertinent symptoms noted in HPI .Review of Systems  Constitutional: Positive for weight loss. Negative for malaise/fatigue.  HENT: Negative for congestion and nosebleeds.   Respiratory: Negative for cough, shortness of breath and wheezing.   Cardiovascular: Negative for claudication and leg swelling (Notably improved since her vein sclerotherapy).  Gastrointestinal: Negative for blood in stool, heartburn and melena.  Genitourinary: Negative for hematuria.  Musculoskeletal: Positive for myalgias (Legs ache at the end of the day). Negative for falls.  Neurological: Positive for dizziness (Usually only with rapid heartbeats but).  Psychiatric/Behavioral: Negative for memory loss. The patient has insomnia. The patient  is not nervous/anxious.   All other systems reviewed and are negative.   I have reviewed and (if needed) personally updated the patient's problem list, medications, allergies, past medical and surgical history, social and family history.   Past Medical History:  Diagnosis Date  . Abnormal Pap smear of cervix    yrs ago  . GERD (gastroesophageal reflux disease)    Followed by Dr. Oletta Lamas. Plan to repeat EGD: In  2021  . Heart palpitations   . Hyperlipidemia   . Hypertension    previous hx  . IBS (irritable bowel syndrome)   . Malignant melanoma ([place]) 2006   History  . Migraine   . Rectal leakage   . Simple endometrial hyperplasia without atypia 08/18/2013  . Skin cancer 09/30/2004   malignant melanoma.    Past Surgical History:  Procedure Laterality Date  . CERVICAL CERCLAGE    . CHOLECYSTECTOMY  1980  . ENDOMETRIAL BIOPSY  2010   negative atypia/hyperplasia  . Holter Monitor  06/2016   24 - ([facility], [place]) close to 119,000 beats. Minimum heart rate 66, average 84, maximum 143 of sinus rhythm. No PVCs noted no VT noted. Just over 16,000 supraventricular contractions/PACs noted. 3 episodes of greater than 10 beats paroxysmal atrial tachycardia (PAT - with max rate of 170 bpm). --> Higher incidence of PACs noted in 19 hours.  Marland Kitchen MELANOMA EXCISION  2006  . OTHER SURGICAL HISTORY     full buttock revision  . SHOULDER ARTHROSCOPY     left 2011/right 2012  . TRANSTHORACIC ECHOCARDIOGRAM  06/23/2016   [facility] ([place]): Normal LV size and function. Hyperdynamic LV function (has EF of 82% recorded) mild aortic regurgitation and mild mitral regurgitation. Mild/borderline grade 1 diastolic dysfunction. Otherwise normal wall thickness.  . WRIST SURGERY     rt wrist     24-hour Holter monitor April 2018: ([facility], [place]) close to 119,000 beats. Minimum heart rate 66, average 84, maximum 143 of sinus rhythm. No PVCs noted no VT noted. Just over 16,000 supraventricular contractions/PACs noted. 3 episodes of greater than 10 beats paroxysmal atrial tachycardia (PAT - with max rate of 170 bpm). --> Higher incidence of PACs noted in 19 hours.  Transthoracic echocardiogram April 2018: [facility] ([place]): Normal LV size and function. Hyperdynamic LV function (has EF of 82% recorded) mild aortic regurgitation and mild mitral regurgitation. Mild/borderline  grade 1 diastolic dysfunction. Otherwise normal wall thickness.  CT Abd-Pelvis w/ Contrast: Aortic and branch vessel atherosclerosis  Current Meds  Medication Sig  . ALPRAZolam (XANAX) 0.25 MG tablet Take 0.25 mg by mouth as needed.  Marland Kitchen BEPREVE 1.5 % SOLN 2 (two) times daily as needed.   Marland Kitchen dexlansoprazole (DEXILANT) 60 MG capsule Take 60 mg by mouth daily.  Marland Kitchen DYMISTA 137-50 MCG/ACT SUSP 2 (two) times daily as needed.   Marland Kitchen glycopyrrolate (ROBINUL) 2 MG tablet Take 1 tablet by mouth daily.  . hydrochlorothiazide (HYDRODIURIL) 25 MG tablet Take 1 tablet (25 mg total) as needed by mouth.  . hyoscyamine (LEVSIN, ANASPAZ) 0.125 MG tablet Take 0.125 mg by mouth as needed.  Marland Kitchen ketoconazole (NIZORAL) 2 % cream 2 (two) times daily as needed.   . metoprolol tartrate (LOPRESSOR) 25 MG tablet Take 0.5 tablets (12.5 mg total) every morning by mouth AND 1.5 tablets (37.5 mg total) every evening.  . montelukast (SINGULAIR) 10 MG tablet daily as needed.   . pravastatin (PRAVACHOL) 40 MG tablet Take 40 mg by mouth daily.  Marland Kitchen  zolpidem (AMBIEN) 10 MG tablet Take 5 mg by mouth at bedtime as needed for sleep.  . [DISCONTINUED] hydrochlorothiazide (HYDRODIURIL) 25 MG tablet Take 25 mg by mouth daily.  . [DISCONTINUED] metoprolol tartrate (LOPRESSOR) 25 MG tablet Take 1 tablet (25 mg total) by mouth 2 (two) times daily.    Allergies  Allergen Reactions  . Latex     Excoriated skin  -   Tegaderm.  . Sulfa Antibiotics Hives  . Shrimp [Shellfish Allergy] Other (See Comments)  . Tegaderm Ag Mesh [Silver] Dermatitis    Social History   Socioeconomic History  . Marital status: Married    Spouse name: None  . Number of children: 2  . Years of education: None  . Highest education level: None  Social Needs  . Financial resource strain: None  . Food insecurity - worry: None  . Food insecurity - inability: None  . Transportation needs - medical: None  . Transportation needs - non-medical: None  Occupational  History  . None  Tobacco Use  . Smoking status: Never Smoker  . Smokeless tobacco: Never Used  Substance and Sexual Activity  . Alcohol use: Yes    Alcohol/week: 4.2 - 6.0 oz    Types: 7 - 10 Glasses of wine per week  . Drug use: No    Comment: As needed Xanax for flying, as needed Ambien for sleeping. Usually for travel  . Sexual activity: Yes    Partners: Male    Comment: husband vasectomy  Other Topics Concern  . None  Social History Narrative   She is a married mother of 2 children, no grandchildren. 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 She lives with her husband who is working on site in [place]. She has a BS/BSN.   She enjoys walking at least 50 minutes to an hour to 3 days a week. When she doesn't do it is because either too hot, too cold or she just feels too lazy.    family history includes Alcohol abuse in her brother; Bipolar disorder in her father; Breast cancer in her mother; Cancer in her father, mother, and sister; Coronary artery disease (age of onset: 37) in her mother; Dementia in her father; Diabetes in her sister; Hypercholesterolemia in her sister; Hyperlipidemia in her mother; Hypertension in her father and mother; Leukemia (age of onset: 84) in her maternal grandmother; Scleroderma in her sister.  Wt Readings from Last 3 Encounters:  01/27/17 147 lb 9.6 oz (67 kg)  09/17/16 158 lb (71.7 kg)  09/16/16 158 lb 12.8 oz (72 kg)    PHYSICAL EXAM BP 132/79   Pulse 66   Ht 5' 4.5" (1.638 m)   Wt 147 lb 9.6 oz (67 kg)   LMP 08/08/2013   SpO2 98%   BMI 24.94 kg/m    Physical Exam  Constitutional: She is oriented to person, place, and time. She appears well-developed and well-nourished. No distress.  Healthy-appearing.  Well-groomed.  HENT:  Head: Normocephalic and atraumatic.  Eyes: EOM are normal. No scleral icterus.  Neck: Normal range of motion. Neck supple. No hepatojugular reflux and no JVD present. Carotid bruit is not present.  Cardiovascular:  Normal rate, regular rhythm, normal heart sounds and intact distal pulses.  No extrasystoles are present. PMI is not displaced. Exam reveals no gallop and no friction rub.  No murmur heard. Pulmonary/Chest: Effort normal and breath sounds normal. No respiratory distress. She has no wheezes. She has no rales.  Abdominal: Soft. Bowel sounds  are normal. She exhibits no distension. There is no tenderness. There is no rebound.  Musculoskeletal: Normal range of motion. She exhibits no edema or deformity.  Thigh-high stockings in place  Neurological: She is alert and oriented to person, place, and time.  Psychiatric: She has a normal mood and affect. Her behavior is normal. Judgment and thought content normal.  Nursing note and vitals reviewed.    Adult ECG Report n/a  Other studies Reviewed: Additional studies/ records that were reviewed today include:  Recent Labs: January 11, 2017  TC 188, TG 105, HDL 58, LDL 109; A1c 6.0 --> since starting Pravachol   ASSESSMENT / PLAN: Problem List Items Addressed This Visit    Essential hypertension (Chronic)    Very well controlled currently.  She did fine being off losartan and I honestly think we can actually stop the HCTZ since she is not really having any edema anymore.  I recommend she just use HCTZ as a as needed.  This will allow me to use more beta-blocker.      Relevant Medications   metoprolol tartrate (LOPRESSOR) 25 MG tablet   hydrochlorothiazide (HYDRODIURIL) 25 MG tablet   metoprolol tartrate (LOPRESSOR) 25 MG tablet   Family history of premature CAD (Chronic)    She is somewhat concerned given her family history of premature coronary disease having hyperlipidemia and hypertension about knowing her baseline risk.  Especially with her being in [place].  We talked about screening for baseline risk.  I think probably the best choice since she is already on a statin would be to check coronary calcium score for risk stratification.  Plan:  Coronary calcium score.      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  Better controlled now with current dose of metoprolol.  States she is usually having these symptoms at night however I am going to switch things up and have her take half tablet in the morning and then 1/2 tablet in the evening and free her up to take an additional 1/2 tablet in the evening if she has a worsening episode.  We will give her some extra metoprolol to ensure that she does not run out.      Relevant Medications   metoprolol tartrate (LOPRESSOR) 25 MG tablet   hydrochlorothiazide (HYDRODIURIL) 25 MG tablet   metoprolol tartrate (LOPRESSOR) 25 MG tablet   Other Relevant Orders   CT CARDIAC SCORING   Hyperlipidemia, mixed (Chronic)    Much better lipid control since having started pravastatin.      Relevant Medications   metoprolol tartrate (LOPRESSOR) 25 MG tablet   hydrochlorothiazide (HYDRODIURIL) 25 MG tablet   metoprolol tartrate (LOPRESSOR) 25 MG tablet      Current medicines are reviewed at length with the patient today. (+/- concerns) n/a The following changes have been made: n/a  Patient Instructions  Medication changes -- switch  How you take metoprolol  -- morning  12.5 mg  and 37.5 mg in the evening -- may take an extra tablet of  12.5 mg metoprolol in the evening if needed.    -- written prescription  Was given to you for metoprolol Tartrate 25 mg twice a day  MAY USE  HCTZ - ON AN AS NEEDED BASIS.    SCHEDULE CALCIUM SCORRING  WILL BE A $150 CHARGE NOT COVERED  INSURANCE.    Your  physician wants you to follow-up in MAY 2019 [place].You will receive a reminder letter in the mail two months in advance. If you don't receive a letter, please call our office to schedule the follow-up appointment.   If you need a refill on your cardiac  medications before your next appointment, please call your pharmacy.     Studies Ordered:   Orders Placed This Encounter  Procedures  . CT CARDIAC SCORING      Glenetta Hew, M.D., M.S. Interventional Cardiologist   Pager # [phone] Phone # [phone] [address] [place], [place] [address]

## 2017-01-27 NOTE — Patient Instructions (Signed)
Medication changes -- switch  How you take metoprolol  -- morning  12.5 mg  and 37.5 mg in the evening -- may take an extra tablet of  12.5 mg metoprolol in the evening if needed.    -- written prescription  Was given to you for metoprolol Tartrate 25 mg twice a day  MAY USE  HCTZ - ON AN AS NEEDED BASIS.    SCHEDULE CALCIUM SCORRING  WILL BE A $150 CHARGE NOT COVERED  INSURANCE.    Your physician wants you to follow-up in MAY 2019 [place].You will receive a reminder letter in the mail two months in advance. If you don't receive a letter, please call our office to schedule the follow-up appointment.   If you need a refill on your cardiac medications before your next appointment, please call your pharmacy.

## 2017-01-27 NOTE — Assessment & Plan Note (Signed)
Frequent PACs with PAT noted on monitor.  Better controlled now with current dose of metoprolol.  States she is usually having these symptoms at night however I am going to switch things up and have her take half tablet in the morning and then 1/2 tablet in the evening and free her up to take an additional 1/2 tablet in the evening if she has a worsening episode.  We will give her some extra metoprolol to ensure that she does not run out.

## 2017-02-02 ENCOUNTER — Ambulatory Visit (INDEPENDENT_AMBULATORY_CARE_PROVIDER_SITE_OTHER)
Admission: RE | Admit: 2017-02-02 | Discharge: 2017-02-02 | Disposition: A | Discharge status: Home / Self Care | Payer: Self-pay | Source: Ambulatory Visit | Attending: Cardiology | Admitting: Cardiology

## 2017-02-02 DIAGNOSIS — Z8249 Family history of ischemic heart disease and other diseases of the circulatory system: Secondary | ICD-10-CM

## 2017-02-02 DIAGNOSIS — I491 Atrial premature depolarization: Secondary | ICD-10-CM

## 2017-02-05 ENCOUNTER — Telehealth: Payer: Self-pay | Admitting: Cardiology

## 2017-02-05 NOTE — Telephone Encounter (Signed)
Pt would like Calcium Score test on Tuesday,she wants to know if her results are back.

## 2017-02-05 NOTE — Telephone Encounter (Signed)
Returned the call to the patient to inform her that the test has not been read by the provider. As soon as the results are in, the nurse will call her. She verbalized her understanding.

## 2017-02-10 ENCOUNTER — Telehealth: Payer: Self-pay | Admitting: Cardiology

## 2017-02-10 NOTE — Telephone Encounter (Signed)
I just read the results for her coronary calcium score.  Low risk study.  At this point would just continue to recommend risk factor modification with sugar and lipid control.  Nothing more needs to be done before she has back [place].  Glenetta Hew, MD

## 2017-02-10 NOTE — Telephone Encounter (Signed)
Pt notified she will see you back in May

## 2017-02-10 NOTE — Telephone Encounter (Signed)
New Message     Pt has a cardiac calcium score last week but has not been followed up with by the office, does Dr Ellyn Hack want to change anything? Needs to know by Monday , she is leaving the country on tuesday

## 2017-07-07 ENCOUNTER — Other Ambulatory Visit: Payer: Self-pay | Admitting: Certified Nurse Midwife

## 2017-07-07 DIAGNOSIS — Z1231 Encounter for screening mammogram for malignant neoplasm of breast: Secondary | ICD-10-CM

## 2017-08-23 ENCOUNTER — Ambulatory Visit: Payer: Managed Care, Other (non HMO) | Admitting: Cardiology

## 2017-08-26 ENCOUNTER — Ambulatory Visit (INDEPENDENT_AMBULATORY_CARE_PROVIDER_SITE_OTHER): Payer: Managed Care, Other (non HMO) | Admitting: Cardiology

## 2017-08-26 ENCOUNTER — Ambulatory Visit: Payer: Managed Care, Other (non HMO) | Admitting: Certified Nurse Midwife

## 2017-08-26 ENCOUNTER — Encounter: Payer: Self-pay | Admitting: Cardiology

## 2017-08-26 VITALS — BP 106/74 | HR 69 | Ht 64.5 in | Wt 143.0 lb

## 2017-08-26 DIAGNOSIS — I10 Essential (primary) hypertension: Secondary | ICD-10-CM | POA: Diagnosis not present

## 2017-08-26 DIAGNOSIS — R0600 Dyspnea, unspecified: Secondary | ICD-10-CM

## 2017-08-26 DIAGNOSIS — I491 Atrial premature depolarization: Secondary | ICD-10-CM

## 2017-08-26 DIAGNOSIS — Z8249 Family history of ischemic heart disease and other diseases of the circulatory system: Secondary | ICD-10-CM | POA: Diagnosis not present

## 2017-08-26 DIAGNOSIS — E782 Mixed hyperlipidemia: Secondary | ICD-10-CM | POA: Diagnosis not present

## 2017-08-26 DIAGNOSIS — R0609 Other forms of dyspnea: Secondary | ICD-10-CM | POA: Diagnosis not present

## 2017-08-26 MED ORDER — METOPROLOL TARTRATE 25 MG PO TABS: ORAL_TABLET | ORAL | 3 refills | Status: DC

## 2017-08-26 MED ORDER — HYDROCHLOROTHIAZIDE 25 MG PO TABS: 25.0000 mg | ORAL_TABLET | ORAL | 3 refills | Status: DC | PRN

## 2017-08-26 NOTE — Patient Instructions (Signed)
MEDICATION INSTRUCTIONS  TAKE METOPROLOL  TARTRATE 37.5 MG IN THE EVENING, BUT MAY TAKE AN ADDITIONAL 25 MG DAILY IF NEEDED- DISCUSS AT APPOINTMENT.    Kristina Delgado-- Your physician has recommended that you have a cardiopulmonary stress test (CPX). CPX testing is a non-invasive measurement of heart and lung function. It replaces a traditional treadmill stress test. This type of test provides a tremendous amount of information that relates not only to your present condition but also for future outcomes. This test combines measurements of you ventilation, respiratory gas exchange in the lungs, electrocardiogram (EKG), blood pressure and physical response before, during, and following an exercise protocol.    Your physician recommends that you schedule a follow-up appointment in [facility].

## 2017-08-26 NOTE — Progress Notes (Signed)
PCP: Marda Stalker, PA-C  Clinic Note: Chief Complaint  Patient presents with  . Follow-up    6 months  . Headache  . Chest Pain    Tension in across her chest.  . Edema    Legs, feet, and ankles from time to time.    HPI: Kristina Delgado is a 62 y.o. female who is being seen today for six-month follow-up PACs.  She notes having some chest discomfort and lower extremity edema off and on. - She apparently had seen a cardiologist in [place] --she and her husband live in [place] where he works for the [employer]  I last saw her in November 2018 --noted less palpitations with the increased metoprolol to 25 mg twice daily with occasional as needed doses.  Noticed decreased frequency.  Does have dizziness with palpitations.  Recent Hospitalizations: NONE  Studies Personally Reviewed - (if available, images/films reviewed: From Epic Chart or Care Everywhere)  No new studies   Interval History: Kristina Delgado returns today noticing that over the last month or so, she is been having more symptoms of palpitations.  She started taking her medications a little bit but she was taking her metoprolol 1/2 tablet in the morning and then the other half tablet in the afternoon with the 1 full tablet in the evening.  This seems to improve the palpitations when they occur. In April she had a different symptom that occurred seem to be a prolonged episode where she felt weak and anxious.  She could barely get herself up off the chair.  She felt like both her heart rate and her blood pressure was high and did she did have some chest discomfort associated.  She did take an extra dose of beta-blocker, but it took a while for her to go away.  Essentially lasted all day.  She she also has been occasional episodes of chest discomfort or shortness of breath that have been  so a longer lasting and the exertional dyspnea has been more prominent.  Besides that one episode of prolonged chest tightness, she denies any resting  chest discomfort with exception of having palpitations and has no PND, orthopnea or edema.  Still notices palpitations, but  Not as frequent with increased dose of beta-blocker   .Other review of cardiac symptoms: Cardiovascular ROS: positive for - dyspnea on exertion negative for - chest pain, edema, loss of consciousness, orthopnea, paroxysmal nocturnal dyspnea, rapid heart rate or shortness of breath    ROS: A comprehensive was performed --  Pertinent symptoms noted in HPI .Review of Systems  Constitutional: Positive for weight loss. Negative for malaise/fatigue.  HENT: Negative for congestion and nosebleeds.   Respiratory: Negative for cough, shortness of breath and wheezing.   Cardiovascular: Negative for claudication and leg swelling (Notably improved since her vein sclerotherapy).  Gastrointestinal: Negative for blood in stool, heartburn and melena.  Genitourinary: Negative for hematuria.  Musculoskeletal: Positive for myalgias (Legs ache at the end of the day). Negative for falls.  Neurological: Positive for dizziness (Usually only with rapid heartbeats but).  Psychiatric/Behavioral: Negative for memory loss. The patient has insomnia. The patient is not nervous/anxious.   All other systems reviewed and are negative.   I have reviewed and (if needed) personally updated the patient's problem list, medications, allergies, past medical and surgical history, social and family history.   Past Medical History:  Diagnosis Date  . Abnormal Pap smear of cervix    yrs ago  . GERD (gastroesophageal reflux disease)  Followed by Dr. Oletta Lamas. Plan to repeat EGD: In 2021  . Heart palpitations   . Hyperlipidemia   . Hypertension    previous hx  . IBS (irritable bowel syndrome)   . Malignant melanoma ([place]) 2006   History  . Migraine   . Rectal leakage   . Simple endometrial hyperplasia without atypia 08/18/2013  . Skin cancer 09/30/2004   malignant melanoma.    Past Surgical  History:  Procedure Laterality Date  . CERVICAL CERCLAGE    . CHOLECYSTECTOMY  1980  . ENDOMETRIAL BIOPSY  2010   negative atypia/hyperplasia  . Holter Monitor  06/2016   24 - ([facility], [place]) close to 119,000 beats. Minimum heart rate 66, average 84, maximum 143 of sinus rhythm. No PVCs noted no VT noted. Just over 16,000 supraventricular contractions/PACs noted. 3 episodes of greater than 10 beats paroxysmal atrial tachycardia (PAT - with max rate of 170 bpm). --> Higher incidence of PACs noted in 19 hours.  Marland Kitchen MELANOMA EXCISION  2006  . OTHER SURGICAL HISTORY     full buttock revision  . SHOULDER ARTHROSCOPY     left 2011/right 2012  . TRANSTHORACIC ECHOCARDIOGRAM  06/23/2016   [facility] ([place]): Normal LV size and function. Hyperdynamic LV function (has EF of 82% recorded) mild aortic regurgitation and mild mitral regurgitation. Mild/borderline grade 1 diastolic dysfunction. Otherwise normal wall thickness.  . WRIST SURGERY     rt wrist     24-hour Holter monitor April 2018: ([facility], [place]) close to 119,000 beats. Minimum heart rate 66, average 84, maximum 143 of sinus rhythm. No PVCs noted no VT noted. Just over 16,000 supraventricular contractions/PACs noted. 3 episodes of greater than 10 beats paroxysmal atrial tachycardia (PAT - with max rate of 170 bpm). --> Higher incidence of PACs noted in 19 hours.  Transthoracic echocardiogram April 2018: [facility] ([place]): Normal LV size and function. Hyperdynamic LV function (has EF of 82% recorded) mild aortic regurgitation and mild mitral regurgitation. Mild/borderline grade 1 diastolic dysfunction. Otherwise normal wall thickness.  CT Abd-Pelvis w/ Contrast: Aortic and branch vessel atherosclerosis  No outpatient medications have been marked as taking for the 08/26/17 encounter (Office Visit) with Leonie Man, MD.    Allergies  Allergen Reactions  . Latex     Excoriated skin  -    Tegaderm.  . Sulfa Antibiotics Hives  . Shrimp [Shellfish Allergy] Other (See Comments)  . Tegaderm Ag Mesh [Silver] Dermatitis   Social History   Tobacco Use  . Smoking status: Never Smoker  . Smokeless tobacco: Never Used  Substance Use Topics  . Alcohol use: Yes    Alcohol/week: 4.2 - 6.0 oz    Types: 7 - 10 Glasses of wine per week  . Drug use: No    Comment: As needed Xanax for flying, as needed Ambien for sleeping. Usually for travel   Social History   Social History Narrative   She is a married mother of 2 children, no grandchildren. 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 She lives with her husband who is working on site in [place]. She has a BS/BSN.   She enjoys walking at least 50 minutes to an hour to 3 days a week. When she doesn't do it is because either too hot, too cold or she just feels too lazy.    family history includes Alcohol abuse in her brother; Bipolar disorder in her father; Breast cancer in her mother; Cancer in  her father, mother, and sister; Coronary artery disease (age of onset: 85) in her mother; Dementia in her father; Diabetes in her sister; Hypercholesterolemia in her sister; Hyperlipidemia in her mother; Hypertension in her father and mother; Leukemia (age of onset: 47) in her maternal grandmother; Scleroderma in her sister.  Wt Readings from Last 3 Encounters:  08/26/17 143 lb (64.9 kg)  01/27/17 147 lb 9.6 oz (67 kg)  09/17/16 158 lb (71.7 kg)    PHYSICAL EXAM BP 106/74 (BP Location: Left Arm, Patient Position: Sitting, Cuff Size: Normal)   Pulse 69   Ht 5' 4.5" (1.638 m)   Wt 143 lb (64.9 kg)   LMP 08/08/2013   BMI 24.17 kg/m   Physical Exam  Constitutional: She is oriented to person, place, and time. She appears well-developed and well-nourished. No distress.  Healthy-appearing.  Well-groomed.  HENT:  Head: Normocephalic and atraumatic.  Eyes: EOM are normal. No scleral icterus.  Neck: Normal range of motion. Neck supple. No  hepatojugular reflux and no JVD present. Carotid bruit is not present.  Cardiovascular: Normal rate, regular rhythm, normal heart sounds and intact distal pulses.  No extrasystoles are present. PMI is not displaced. Exam reveals no gallop and no friction rub.  No murmur heard. Pulmonary/Chest: Effort normal and breath sounds normal. No respiratory distress. She has no wheezes. She has no rales.  Abdominal: Soft. Bowel sounds are normal. She exhibits no distension. There is no tenderness. There is no rebound.  Musculoskeletal: Normal range of motion. She exhibits no edema or deformity.  Thigh-high stockings in place  Neurological: She is alert and oriented to person, place, and time.  Psychiatric: She has a normal mood and affect. Her behavior is normal. Judgment and thought content normal.  Nursing note and vitals reviewed.    Adult ECG Report n/a  Other studies Reviewed: Additional studies/ records that were reviewed today include:  Recent Labs: January 11, 2017  TC 188, TG 105, HDL 58, LDL 109; A1c 6.0 --> since starting Pravachol   ASSESSMENT / PLAN: Problem List Items Addressed This Visit    Hyperlipidemia, mixed (Chronic)    Starting pravastatin and that is marked by PCP.  Labs last checked look pretty well controlled for her risk, may consider a more aggressive future.      Relevant Medications   metoprolol tartrate (LOPRESSOR) 25 MG tablet   hydrochlorothiazide (HYDRODIURIL) 25 MG tablet   Finding of multiple premature atrial contractions by electrocardiography - Primary (Chronic)    Frequent PACs and PAT noted on monitor.  Has been controlled on beta-blocker, but I think we will adjust her dosing just a little bit and allow for PRN additional dose. Monitor for prolonged episode.  Discussed Kardia (by [employer])      Relevant Medications   metoprolol tartrate (LOPRESSOR) 25 MG tablet   hydrochlorothiazide (HYDRODIURIL) 25 MG tablet   Other Relevant Orders   EKG 12-Lead  (Completed)   Cardiopulmonary exercise test   Family history of premature CAD (Chronic)    Monitoring for signs of premature CAD.  Working on lipids as noted. Blood pressure looks good.m  Will evaluate Sx w withith a CPX (cardiopulmonary stress test      Relevant Orders   EKG 12-Lead (Completed)   Cardiopulmonary exercise test   Essential hypertension (Chronic)    Continue beta-blocker.  We will stop HCTZ or make PRN      Relevant Medications   metoprolol tartrate (LOPRESSOR) 25 MG tablet   hydrochlorothiazide (HYDRODIURIL) 25  MG tablet   DOE (dyspnea on exertion)    She is having episodes that she describes most notably in April where she just felt short of breath and chest discomfort associated with it.  Has not noticed any exertional symptoms since, but given her status as otherwise healthy, we can recheck a stress test to determine severity of symptoms.    Plan: Cardiopulmonary Stress Test (CPX)      Relevant Orders   EKG 12-Lead (Completed)   Cardiopulmonary exercise test      Current medicines are reviewed at length with the patient today. (+/- concerns) n questions about beta-blocker dosing/a The following changes have been made:See below  Patient Instructions  MEDICATION INSTRUCTIONS  TAKE METOPROLOL  TARTRATE 37.5 MG IN THE EVENING, BUT MAY TAKE AN ADDITIONAL 25 MG DAILY IF NEEDED- DISCUSS AT APPOINTMENT.    [place]-- Your physician has recommended that you have a cardiopulmonary stress test (CPX). CPX testing is a non-invasive measurement of heart and lung function. It replaces a traditional treadmill stress test. This type of test provides a tremendous amount of information that relates not only to your present condition but also for future outcomes. This test combines measurements of you ventilation, respiratory gas exchange in the lungs, electrocardiogram (EKG), blood pressure and physical response before, during, and following an exercise  protocol.    Your physician recommends that you schedule a follow-up appointment in [place] TEST.    Studies Ordered:   Orders Placed This Encounter  Procedures  . Cardiopulmonary exercise test  . EKG 12-Lead      Glenetta Hew, M.D., M.S. Interventional Cardiologist   Pager # [phone] Phone # [phone] [address] [place], [place] [address]

## 2017-08-27 ENCOUNTER — Encounter: Payer: Self-pay | Admitting: Cardiology

## 2017-08-28 ENCOUNTER — Encounter: Payer: Self-pay | Admitting: Cardiology

## 2017-08-28 NOTE — Assessment & Plan Note (Signed)
She is having episodes that she describes most notably in April where she just felt short of breath and chest discomfort associated with it.  Has not noticed any exertional symptoms since, but given her status as otherwise healthy, we can recheck a stress test to determine severity of symptoms.    Plan: Cardiopulmonary Stress Test (CPX)

## 2017-08-28 NOTE — Assessment & Plan Note (Signed)
Continue beta-blocker.  We will stop HCTZ or make PRN

## 2017-08-28 NOTE — Assessment & Plan Note (Signed)
Monitoring for signs of premature CAD.  Working on lipids as noted. Blood pressure looks good.m  Will evaluate Sx w withith a CPX (cardiopulmonary stress test

## 2017-08-28 NOTE — Assessment & Plan Note (Signed)
Frequent PACs and PAT noted on monitor.  Has been controlled on beta-blocker, but I think we will adjust her dosing just a little bit and allow for PRN additional dose. Monitor for prolonged episode.  Discussed Kardia (by [employer])

## 2017-08-28 NOTE — Assessment & Plan Note (Signed)
Starting pravastatin and that is marked by PCP.  Labs last checked look pretty well controlled for her risk, may consider a more aggressive future.

## 2017-09-02 ENCOUNTER — Other Ambulatory Visit (HOSPITAL_COMMUNITY): Payer: Self-pay | Admitting: *Deleted

## 2017-09-02 ENCOUNTER — Ambulatory Visit (HOSPITAL_COMMUNITY): Payer: Managed Care, Other (non HMO) | Attending: Cardiology

## 2017-09-02 DIAGNOSIS — R87619 Unspecified abnormal cytological findings in specimens from cervix uteri: Secondary | ICD-10-CM | POA: Diagnosis present

## 2017-09-02 DIAGNOSIS — Z8249 Family history of ischemic heart disease and other diseases of the circulatory system: Secondary | ICD-10-CM

## 2017-09-02 DIAGNOSIS — R9439 Abnormal result of other cardiovascular function study: Secondary | ICD-10-CM | POA: Diagnosis not present

## 2017-09-02 DIAGNOSIS — I10 Essential (primary) hypertension: Secondary | ICD-10-CM | POA: Diagnosis not present

## 2017-09-02 DIAGNOSIS — G43909 Migraine, unspecified, not intractable, without status migrainosus: Secondary | ICD-10-CM | POA: Diagnosis not present

## 2017-09-02 DIAGNOSIS — K589 Irritable bowel syndrome without diarrhea: Secondary | ICD-10-CM | POA: Insufficient documentation

## 2017-09-02 DIAGNOSIS — I491 Atrial premature depolarization: Secondary | ICD-10-CM

## 2017-09-02 DIAGNOSIS — C439 Malignant melanoma of skin, unspecified: Secondary | ICD-10-CM | POA: Insufficient documentation

## 2017-09-02 DIAGNOSIS — R0609 Other forms of dyspnea: Secondary | ICD-10-CM

## 2017-09-02 DIAGNOSIS — K219 Gastro-esophageal reflux disease without esophagitis: Secondary | ICD-10-CM | POA: Diagnosis not present

## 2017-09-02 DIAGNOSIS — N8501 Benign endometrial hyperplasia: Secondary | ICD-10-CM | POA: Insufficient documentation

## 2017-09-02 DIAGNOSIS — R002 Palpitations: Secondary | ICD-10-CM | POA: Diagnosis not present

## 2017-09-02 DIAGNOSIS — K6289 Other specified diseases of anus and rectum: Secondary | ICD-10-CM | POA: Insufficient documentation

## 2017-09-02 DIAGNOSIS — R0600 Dyspnea, unspecified: Secondary | ICD-10-CM

## 2017-09-02 HISTORY — PX: OTHER SURGICAL HISTORY: SHX169

## 2017-09-06 ENCOUNTER — Encounter: Payer: Self-pay | Admitting: Certified Nurse Midwife

## 2017-09-06 ENCOUNTER — Telehealth: Payer: Self-pay | Admitting: Certified Nurse Midwife

## 2017-09-06 NOTE — Telephone Encounter (Signed)
Dr.Miller, okay to place a referral for patient for family therapy with Marya Amsler?

## 2017-09-06 NOTE — Telephone Encounter (Signed)
Patient sent the following correspondence through [place]. Routing to triage to assist patient with request.  ----- Message from [place], Generic sent at 09/06/2017 10:44 AM EDT -----    Hi, there! I will see you next week but I was wondering if you could help me with a referral before then. We are looking for a family therapist to help [place] and my younger son, [name], work towards a self-sustaining career and more independence. [name] does see a therapist, one-on-one but we do not want to impact that relationship. I am hoping to get started with someone before I leave on the 10th of July. Any suggestions? Thanks, Kristina Delgado.

## 2017-09-08 ENCOUNTER — Encounter: Payer: Self-pay | Admitting: Cardiology

## 2017-09-08 ENCOUNTER — Ambulatory Visit: Payer: Managed Care, Other (non HMO)

## 2017-09-08 DIAGNOSIS — R9430 Abnormal result of cardiovascular function study, unspecified: Secondary | ICD-10-CM | POA: Insufficient documentation

## 2017-09-08 NOTE — Telephone Encounter (Signed)
Kristina Delgado at [facility] may be a better option as she primarily focuses on Family counseling.  Please let her know that [facility] does work with some very specialized life issues that cause stressors so I think this counseling practice could be a good fit.  That's where I would start.  Thanks.

## 2017-09-08 NOTE — Telephone Encounter (Signed)
Spoke with patient. All information given as seen below from [place]. Provided contact information for patient to call and schedule an appointment with Ellamae Sia. Will call if she needs assistance.   [facility] [address]  [place], [place]  [phone]   Will close encounter.

## 2017-09-09 ENCOUNTER — Telehealth: Payer: Self-pay | Admitting: *Deleted

## 2017-09-09 DIAGNOSIS — R0609 Other forms of dyspnea: Secondary | ICD-10-CM

## 2017-09-09 DIAGNOSIS — R9430 Abnormal result of cardiovascular function study, unspecified: Secondary | ICD-10-CM

## 2017-09-09 DIAGNOSIS — R0600 Dyspnea, unspecified: Secondary | ICD-10-CM

## 2017-09-09 DIAGNOSIS — I491 Atrial premature depolarization: Secondary | ICD-10-CM

## 2017-09-09 DIAGNOSIS — Z01818 Encounter for other preprocedural examination: Secondary | ICD-10-CM

## 2017-09-09 NOTE — Telephone Encounter (Signed)
Spoke to patient. Aware Dr Ellyn Hack would like to schedule a coronary CT angiography. Patient would like to see if it can be done  Tuesday 6/25/ or 6/27,6/28 or 7/5. Will contact patient- with information.

## 2017-09-09 NOTE — Telephone Encounter (Signed)
-----   Message from Leonie Man, MD sent at 09/08/2017  5:49 PM EDT ----- Overall, symptomatically, the CPX does not suggest any pulmonary or circulatory limitation to exercise with normal functional capacity.  There is concern however that the EKG had some nonspecific nondiagnostic findings.  Recommendation by the reading physician would be to consider coronary CT angiography depending on symptoms.    If Kristina Delgado is interested, we could try to get this done prior to her going back to [place].  It would basically give her a baseline cardiac risk assessment and detect evidence of coronary stenosis.  Glenetta Hew, MD  --I added new diagnosis to be on the order this.

## 2017-09-10 NOTE — Telephone Encounter (Signed)
Spoke to patient.Aware still working on  Appointment for [place].

## 2017-09-12 ENCOUNTER — Encounter: Payer: Self-pay | Admitting: Cardiology

## 2017-09-14 ENCOUNTER — Ambulatory Visit: Payer: Managed Care, Other (non HMO) | Admitting: Cardiology

## 2017-09-14 NOTE — Telephone Encounter (Signed)
Spoke to patient. Information given and confirmed about up coming CCTA.  PATIENT IS AWARE OF BMP-LAB NEEDED,AND METOPROLOL USE DAY OF TEST AS WELL AS NOT INGESTING DECAF, [place].OR CAFF. OR ANTIHISTAMINE 12 HOURS PRIOR TO TEST.Kristina Delgado   PATIENT IS AWARE  INSTRUCTION WILL BE SENT VIA MYCHART. PATIENT STATES SHE HAS NO QUESTION AT PRESENT TIME.  FOLLOW UP APPOINTMENT GIVEN. PATIENT VERBALIZED UNDERSTANDING.

## 2017-09-15 ENCOUNTER — Encounter: Payer: Self-pay | Admitting: Certified Nurse Midwife

## 2017-09-15 ENCOUNTER — Other Ambulatory Visit: Payer: Self-pay | Admitting: Certified Nurse Midwife

## 2017-09-15 ENCOUNTER — Ambulatory Visit (INDEPENDENT_AMBULATORY_CARE_PROVIDER_SITE_OTHER): Payer: 59 | Admitting: Certified Nurse Midwife

## 2017-09-15 ENCOUNTER — Ambulatory Visit
Admission: RE | Admit: 2017-09-15 | Discharge: 2017-09-15 | Disposition: A | Discharge status: Home / Self Care | Payer: 59 | Source: Ambulatory Visit | Attending: Certified Nurse Midwife | Admitting: Certified Nurse Midwife

## 2017-09-15 ENCOUNTER — Other Ambulatory Visit: Payer: Self-pay

## 2017-09-15 VITALS — BP 118/70 | HR 70 | Resp 16 | Ht 64.25 in | Wt 152.0 lb

## 2017-09-15 DIAGNOSIS — Z1231 Encounter for screening mammogram for malignant neoplasm of breast: Secondary | ICD-10-CM

## 2017-09-15 DIAGNOSIS — N951 Menopausal and female climacteric states: Secondary | ICD-10-CM | POA: Diagnosis not present

## 2017-09-15 DIAGNOSIS — Z780 Asymptomatic menopausal state: Secondary | ICD-10-CM

## 2017-09-15 DIAGNOSIS — Z01419 Encounter for gynecological examination (general) (routine) without abnormal findings: Secondary | ICD-10-CM

## 2017-09-15 NOTE — Progress Notes (Signed)
62 y.o. [id number] Married  Caucasian Fe here for annual exam. Post menopausal no HRT. Denies vaginal bleeding. Some vaginal dryness, has noted slight increase in odor with no itching or burning. Occasional fecal incontinence,(old problem) has good bowel habits established for management. Seeing cardiology for increase PAC,'s and evaluation for shortness of breath with CT angiogram soon.  Sees PCP for labs, Cholesterol, Vitamin D and other health issues as needed. Was noted to have slight elevation of Hgb. A1-c and follow up as needed.. Will be seeing pulmonary also if needed. No significant memory changes recently. Has mammogram scheduled for today. Son getting married next year. No other health issues today.  Patient's last menstrual period was 08/08/2013.          Sexually active: Yes.    The current method of family planning is vasectomy.    Exercising: Yes.    walking Smoker:  no  Health Maintenance: Pap:  09-25-14 neg HPV HR neg, 09-17-16 neg History of Abnormal Pap: yes, yrs ago MMG:  today Self Breast exams: no Colonoscopy:  2016 normal BMD:   2016 normal repeat 3-5 years TDaP:  2013 Shingles: had done Pneumonia: had done Hep C and HIV: hep c neg 2017 Labs: if needed   reports that she has never smoked. She has never used smokeless tobacco. She reports that she drinks about 4.2 - 6.0 oz of alcohol per week. She reports that she does not use drugs.  Past Medical History:  Diagnosis Date  . Abnormal Pap smear of cervix    yrs ago  . GERD (gastroesophageal reflux disease)    Followed by Dr. Oletta Lamas. Plan to repeat EGD: In 2021  . Heart palpitations   . Hyperlipidemia   . Hypertension    previous hx  . IBS (irritable bowel syndrome)   . Malignant melanoma ([place]) 2006   History  . Migraine   . Rectal leakage   . Simple endometrial hyperplasia without atypia 08/18/2013  . Skin cancer 09/30/2004   malignant melanoma.    Past Surgical History:  Procedure Laterality Date  .  CERVICAL CERCLAGE    . CHOLECYSTECTOMY  1980  . ENDOMETRIAL BIOPSY  2010   negative atypia/hyperplasia  . Holter Monitor  06/2016   24 - ([facility], [place]) close to 119,000 beats. Minimum heart rate 66, average 84, maximum 143 of sinus rhythm. No PVCs noted no VT noted. Just over 16,000 supraventricular contractions/PACs noted. 3 episodes of greater than 10 beats paroxysmal atrial tachycardia (PAT - with max rate of 170 bpm). --> Higher incidence of PACs noted in 19 hours.  Marland Kitchen MELANOMA EXCISION  2006  . OTHER SURGICAL HISTORY     full buttock revision  . SHOULDER ARTHROSCOPY     left 2011/right 2012  . TRANSTHORACIC ECHOCARDIOGRAM  06/23/2016   [facility] ([place]): Normal LV size and function. Hyperdynamic LV function (has EF of 82% recorded) mild aortic regurgitation and mild mitral regurgitation. Mild/borderline grade 1 diastolic dysfunction. Otherwise normal wall thickness.  . WRIST SURGERY     rt wrist    Current Outpatient Medications  Medication Sig Dispense Refill  . ALPRAZolam (XANAX) 0.25 MG tablet Take 0.25 mg by mouth as needed.    Marland Kitchen BEPREVE 1.5 % SOLN 2 (two) times daily as needed.     . desoximetasone (TOPICORT) 0.05 % cream Apply 1 application topically 2 (two) times daily.    Marland Kitchen dexlansoprazole (DEXILANT) 60 MG capsule Take 60 mg by mouth daily.    Marland Kitchen  DYMISTA 137-50 MCG/ACT SUSP 2 (two) times daily as needed.     Marland Kitchen glycopyrrolate (ROBINUL) 2 MG tablet Take 1 tablet by mouth daily.    . hydrochlorothiazide (HYDRODIURIL) 25 MG tablet Take 1 tablet (25 mg total) by mouth as needed. 90 tablet 3  . hyoscyamine (LEVSIN, ANASPAZ) 0.125 MG tablet Take 0.125 mg by mouth as needed.    Marland Kitchen ketoconazole (NIZORAL) 2 % cream 2 (two) times daily as needed.     . metoprolol tartrate (LOPRESSOR) 25 MG tablet Take 1 tablet (25 mg total) 2 (two) times daily by mouth. 60 tablet 11  . metoprolol tartrate (LOPRESSOR) 25 MG tablet Take  37.5 mg in the evening , may take an  additional 25 mg tablet as needed daily 270 tablet 3  . montelukast (SINGULAIR) 10 MG tablet daily as needed.     . pravastatin (PRAVACHOL) 40 MG tablet Take 40 mg by mouth daily.    Marland Kitchen zolpidem (AMBIEN) 10 MG tablet Take 5 mg by mouth at bedtime as needed for sleep.     No current facility-administered medications for this visit.     Family History  Problem Relation Age of Onset  . Breast cancer Mother   . Cancer Mother        bladder cancer,  precancerous moles.  . Hypertension Mother   . Coronary artery disease Mother 97       PCI 3 - angina  . Hyperlipidemia Mother   . Cancer Sister        has atypical moles.  . Diabetes Sister   . Scleroderma Sister   . Hypercholesterolemia Sister   . Cancer Father        bladder  . Hypertension Father   . Bipolar disorder Father   . Dementia Father   . Alcohol abuse Brother   . Leukemia Maternal Grandmother 48    ROS:  Pertinent items are noted in HPI.  Otherwise, a comprehensive ROS was negative.  Exam:   LMP 08/08/2013    Ht Readings from Last 3 Encounters:  08/26/17 5' 4.5" (1.638 m)  01/27/17 5' 4.5" (1.638 m)  09/17/16 5' 4.5" (1.638 m)    General appearance: alert, cooperative and appears stated age Head: Normocephalic, without obvious abnormality, atraumatic Neck: no adenopathy, supple, symmetrical, trachea midline and thyroid normal to inspection and palpation Lungs: clear to auscultation bilaterally Breasts: normal appearance, no masses or tenderness, No nipple retraction or dimpling, No nipple discharge or bleeding, No axillary or supraclavicular adenopathy Heart: regular rate and rhythm Abdomen: soft, non-tender; no masses,  no organomegaly Extremities: extremities normal, atraumatic, no cyanosis or edema Skin: Skin color, texture, turgor normal. No rashes or lesions Lymph nodes: Cervical, supraclavicular, and axillary nodes normal. No abnormal inguinal nodes palpated Neurologic: Grossly normal   Pelvic:  External genitalia:  no lesions, normal female              Urethra:  normal appearing urethra with no masses, tenderness or lesions              Bartholin's and Skene's: normal                 Vagina: normal appearing vagina with slight dryness normal color and non odorous scant discharge, no lesions              Cervix: no cervical motion tenderness, no lesions and normal appearance              Pap taken: No.  Bimanual Exam:  Uterus:  normal size, contour, position, consistency, mobility, non-tender              Adnexa: normal adnexa and no mass, fullness, tenderness               Rectovaginal: Confirms               Anus:  normal sphincter tone, no lesions, slight increase in tissue at anal entrance from previous 4 th degree repair, non tender, good tone  Chaperone present: yes  A:  Well Woman with normal exam  Post menopausal no HRT  Under evaluation for possible cardiac issue with CT with angiogram to be scheduled  MD management of cholesterol and singulair  P:   Reviewed health and wellness pertinent to exam  Aware of need to advise if vaginal bleeding, discussed no odor or unusual discharge noted with vaginal exam. Discussed dryness can sometimes cause this. Discussed coconut oil or Olive oil use to see if this helps. Will advise.  Continue follow up with Cardiology/MD as indicated for other health issues.  Pap smear: no  counseled on breast self exam, mammography screening, feminine hygiene, adequate intake of calcium and vitamin D, diet and exercise  return annually or prn  An After Visit Summary was printed and given to the patient.

## 2017-09-15 NOTE — Patient Instructions (Signed)

## 2017-09-20 HISTORY — PX: OTHER SURGICAL HISTORY: SHX169

## 2017-09-21 LAB — BASIC METABOLIC PANEL
BUN/Creatinine Ratio: 18 (ref 12–28)
BUN: 13 mg/dL (ref 8–27)
CHLORIDE: 94 mmol/L — AB (ref 96–106)
CO2: 29 mmol/L (ref 20–29)
Calcium: 9.7 mg/dL (ref 8.7–10.3)
Creatinine, Ser: 0.73 mg/dL (ref 0.57–1.00)
GFR calc non Af Amer: 88 mL/min/{1.73_m2} (ref >59)
GFR, EST AFRICAN AMERICAN: 101 mL/min/{1.73_m2} (ref >59)
GLUCOSE: 92 mg/dL (ref 65–99)
POTASSIUM: 4 mmol/L (ref 3.5–5.2)
Sodium: 136 mmol/L (ref 134–144)

## 2017-09-23 ENCOUNTER — Encounter: Payer: Self-pay | Admitting: Cardiology

## 2017-09-24 ENCOUNTER — Ambulatory Visit (HOSPITAL_COMMUNITY): Admission: RE | Admit: 2017-09-24 | Payer: Managed Care, Other (non HMO) | Source: Ambulatory Visit

## 2017-09-24 ENCOUNTER — Ambulatory Visit (HOSPITAL_COMMUNITY)
Admission: RE | Admit: 2017-09-24 | Discharge: 2017-09-24 | Disposition: A | Discharge status: Home / Self Care | Payer: Managed Care, Other (non HMO) | Source: Ambulatory Visit | Attending: Cardiology | Admitting: Cardiology

## 2017-09-24 ENCOUNTER — Encounter: Payer: Self-pay | Admitting: Cardiology

## 2017-09-24 DIAGNOSIS — R0600 Dyspnea, unspecified: Secondary | ICD-10-CM

## 2017-09-24 DIAGNOSIS — I491 Atrial premature depolarization: Secondary | ICD-10-CM

## 2017-09-24 DIAGNOSIS — R9430 Abnormal result of cardiovascular function study, unspecified: Secondary | ICD-10-CM

## 2017-09-24 DIAGNOSIS — R0609 Other forms of dyspnea: Secondary | ICD-10-CM

## 2017-09-24 NOTE — Telephone Encounter (Signed)
Patient aware test schedule on Monday JULY 8,2019 AT 12:30 PM

## 2017-09-24 NOTE — Telephone Encounter (Signed)
Spoke to patient. RN informed her that the person  dealing with the appointment and pre cert is working on the situation. Please be ready today for standby for the test. Patient voiced understanding.   Patient states she did have caffeine yesterday but it was @ 4 pm. RN  informed patient that would be fine- it has been more than 12 hours.   patient does have authorization from  [facility] # [id number]  Exp 12-09-17.

## 2017-09-27 ENCOUNTER — Ambulatory Visit (HOSPITAL_COMMUNITY)
Admission: RE | Admit: 2017-09-27 | Discharge: 2017-09-27 | Disposition: A | Discharge status: Home / Self Care | Payer: 59 | Source: Ambulatory Visit | Attending: Cardiology | Admitting: Cardiology

## 2017-09-27 ENCOUNTER — Ambulatory Visit (HOSPITAL_COMMUNITY): Payer: 59

## 2017-09-27 DIAGNOSIS — R0609 Other forms of dyspnea: Secondary | ICD-10-CM | POA: Diagnosis not present

## 2017-09-27 DIAGNOSIS — E785 Hyperlipidemia, unspecified: Secondary | ICD-10-CM | POA: Insufficient documentation

## 2017-09-27 DIAGNOSIS — R9430 Abnormal result of cardiovascular function study, unspecified: Secondary | ICD-10-CM | POA: Diagnosis present

## 2017-09-27 DIAGNOSIS — I2510 Atherosclerotic heart disease of native coronary artery without angina pectoris: Secondary | ICD-10-CM | POA: Diagnosis not present

## 2017-09-27 DIAGNOSIS — R0600 Dyspnea, unspecified: Secondary | ICD-10-CM

## 2017-09-27 DIAGNOSIS — I491 Atrial premature depolarization: Secondary | ICD-10-CM | POA: Insufficient documentation

## 2017-09-27 DIAGNOSIS — I700 Atherosclerosis of aorta: Secondary | ICD-10-CM | POA: Diagnosis not present

## 2017-09-27 MED ORDER — METOPROLOL TARTRATE 5 MG/5ML IV SOLN
5.0000 mg | INTRAVENOUS | Status: DC | PRN
  Filled 2017-09-27: qty 5

## 2017-09-27 MED ORDER — METOPROLOL TARTRATE 5 MG/5ML IV SOLN
INTRAVENOUS | Status: AC
  Administered 2017-09-27: 5 mg
  Filled 2017-09-27: qty 5

## 2017-09-27 MED ORDER — IOPAMIDOL (ISOVUE-370) INJECTION 76%
INTRAVENOUS | Status: AC
  Administered 2017-09-27: 80 mL
  Filled 2017-09-27: qty 100

## 2017-09-27 MED ORDER — IOPAMIDOL (ISOVUE-370) INJECTION 76%
INTRAVENOUS | Status: AC
  Filled 2017-09-27: qty 100

## 2017-09-27 MED ORDER — NITROGLYCERIN 0.4 MG SL SUBL
SUBLINGUAL_TABLET | SUBLINGUAL | Status: AC
  Filled 2017-09-27: qty 2

## 2017-09-27 MED ORDER — NITROGLYCERIN 0.4 MG SL SUBL
0.8000 mg | SUBLINGUAL_TABLET | Freq: Once | SUBLINGUAL | Status: AC
  Administered 2017-09-27: 0.8 mg via SUBLINGUAL

## 2017-09-30 ENCOUNTER — Other Ambulatory Visit: Payer: Self-pay | Admitting: Cardiology

## 2017-10-01 ENCOUNTER — Encounter: Payer: Self-pay | Admitting: Cardiology

## 2017-10-01 ENCOUNTER — Ambulatory Visit (INDEPENDENT_AMBULATORY_CARE_PROVIDER_SITE_OTHER): Payer: 59 | Admitting: Cardiology

## 2017-10-01 VITALS — BP 125/82 | HR 67 | Ht 64.0 in | Wt 152.4 lb

## 2017-10-01 DIAGNOSIS — I491 Atrial premature depolarization: Secondary | ICD-10-CM | POA: Diagnosis not present

## 2017-10-01 DIAGNOSIS — E782 Mixed hyperlipidemia: Secondary | ICD-10-CM

## 2017-10-01 DIAGNOSIS — R0609 Other forms of dyspnea: Secondary | ICD-10-CM | POA: Diagnosis not present

## 2017-10-01 DIAGNOSIS — R0600 Dyspnea, unspecified: Secondary | ICD-10-CM

## 2017-10-01 DIAGNOSIS — I10 Essential (primary) hypertension: Secondary | ICD-10-CM | POA: Diagnosis not present

## 2017-10-01 DIAGNOSIS — Z8249 Family history of ischemic heart disease and other diseases of the circulatory system: Secondary | ICD-10-CM | POA: Diagnosis not present

## 2017-10-01 MED ORDER — METOPROLOL TARTRATE 25 MG PO TABS: ORAL_TABLET | ORAL | 3 refills | Status: DC

## 2017-10-01 NOTE — Progress Notes (Signed)
PCP: Marda Stalker, PA-C  Clinic Note: Chief Complaint  Patient presents with  . Follow-up    CT  . Palpitations    Controlled with beta-blocker  . Shortness of Breath    Happy with the results    HPI: Kristina Delgado is a 62 y.o. female who is being seen today for six-month follow-up PACs.  She notes having some chest discomfort and lower extremity edema off and on. - She apparently had seen a cardiologist in [place] --she and her husband live in [place] where he works for the [employer]  I last saw her in November 2018 --noted less palpitations with the increased metoprolol to 25 mg twice daily with occasional as needed doses.  Noticed decreased frequency.  Does have dizziness with palpitations.  Recent Hospitalizations: NONE  Studies Personally Reviewed - (if available, images/films reviewed: From Epic Chart or Care Everywhere)  CPX September 02, 2017: No suggestion of any pulmonary or circulatory limitation to exercise with normal functional capacity but there is concern for EKG changes there were still nondiagnostic.  Recommendation was to check a coronary CT angiogram  Coronary CT angiogram September 27, 2017: Aortic atherosclerosis noted.  Calcium score 90.  Mild nonobstructive disease in the LAD less than 50%.  Minimal disease in the RCA.  Recommend aggressive risk factor modification.  Interval History: Kristina Delgado returns today happy to hear the results of her CPX-Coronary CTA.  She feels better with the adjustment of her Beta Blocker dosing - taking 1.5 tabs in the PM with prn 12.5 -25 mg during the day. He says that he takes at least 1/2 tab (12.5 mg) at least every other day & sometimes takes a full 25 mg - less often.  Palpitations are there - but this regimen has kept them at [place]. No more prolonged episodes.    She still notes that she can get SOB going up & down stairs, but admits to being a bit out of shape.  No further concern of chest discomfort.    No PND/orthopnea or edema.  No  syncope, near syncope or TIA/amaurosis fugax symptoms.  Marland Kitchenther review of cardiac symptoms: Cardiovascular ROS: positive for - dyspnea on exertion, palpitations and Well-controlled palpitations negative for - chest pain, edema, loss of consciousness, orthopnea, paroxysmal nocturnal dyspnea, rapid heart rate or shortness of breath   ROS: A comprehensive was performed --  Pertinent symptoms noted in HPI .Review of Systems  Constitutional: Positive for weight loss. Negative for malaise/fatigue.  HENT: Negative for congestion and nosebleeds.   Respiratory: Negative for cough, shortness of breath and wheezing.   Cardiovascular: Negative for claudication and leg swelling (Notably improved since her vein sclerotherapy).  Gastrointestinal: Negative for blood in stool, heartburn and melena.  Genitourinary: Negative for hematuria.  Musculoskeletal: Positive for myalgias (Legs ache at the end of the day). Negative for falls.  Neurological: Positive for dizziness (Usually only with rapid heartbeats but).  Psychiatric/Behavioral: Negative for memory loss (She has been having some memory issues.  Her PCP about a statin holiday, unfortunately her lipids do not look as good as it should.). The patient has insomnia. The patient is not nervous/anxious.   All other systems reviewed and are negative.   I have reviewed and (if needed) personally updated the patient's problem list, medications, allergies, past medical and surgical history, social and family history.   Past Medical History:  Diagnosis Date  . Abnormal Pap smear of cervix    yrs ago  . GERD (gastroesophageal reflux  disease)    Followed by Dr. Oletta Lamas. Plan to repeat EGD: In 2021  . Heart palpitations   . Hyperlipidemia   . Hypertension    previous hx  . IBS (irritable bowel syndrome)   . Malignant melanoma ([place]) 2006   History  . Migraine   . Rectal leakage   . Simple endometrial hyperplasia without atypia 08/18/2013  . Skin cancer  09/30/2004   malignant melanoma.    Past Surgical History:  Procedure Laterality Date  . CARDIOPULMONARY EXERCISE TEST  09/02/2017   No suggestion of any pulmonary or circulatory limitation to exercise with normal functional capacity but there is concern for EKG changes there were still nondiagnostic.  Recommendation was to check a coronary CT angiogram  . CERVICAL CERCLAGE    . CHOLECYSTECTOMY  1980  . CORONARY CT ANGIOGRAM  09/2017    Aortic atherosclerosis noted.  Calcium score 90.  Mild nonobstructive disease in the LAD less than 50%.  Minimal disease in the RCA.  Recommend aggressive risk factor modification.  . ENDOMETRIAL BIOPSY  2010   negative atypia/hyperplasia  . Holter Monitor  06/2016   24 - ([facility], [place]) close to 119,000 beats. Minimum heart rate 66, average 84, maximum 143 of sinus rhythm. No PVCs noted no VT noted. Just over 16,000 supraventricular contractions/PACs noted. 3 episodes of greater than 10 beats paroxysmal atrial tachycardia (PAT - with max rate of 170 bpm). --> Higher incidence of PACs noted in 19 hours.  Marland Kitchen MELANOMA EXCISION  2006  . OTHER SURGICAL HISTORY     full buttock revision  . SHOULDER ARTHROSCOPY     left 2011/right 2012  . TRANSTHORACIC ECHOCARDIOGRAM  06/23/2016   [facility] ([place]): Normal LV size and function. Hyperdynamic LV function (has EF of 82% recorded) mild aortic regurgitation and mild mitral regurgitation. Mild/borderline grade 1 diastolic dysfunction. Otherwise normal wall thickness.  . WRIST SURGERY     rt wrist   She Current Meds  Medication Sig  . ALPRAZolam (XANAX) 0.25 MG tablet Take 0.25 mg by mouth as needed.  Marland Kitchen BEPREVE 1.5 % SOLN 2 (two) times daily as needed.   . desoximetasone (TOPICORT) 0.05 % cream Apply 1 application topically 2 (two) times daily.  Marland Kitchen dexlansoprazole (DEXILANT) 60 MG capsule Take 60 mg by mouth as needed.   Marland Kitchen DYMISTA 137-50 MCG/ACT SUSP 2 (two) times daily as needed.   Marland Kitchen  glycopyrrolate (ROBINUL) 2 MG tablet Take 1 mg by mouth daily.   . hydrochlorothiazide (HYDRODIURIL) 25 MG tablet Take 1 tablet (25 mg total) by mouth as needed.  . hyoscyamine (LEVSIN, ANASPAZ) 0.125 MG tablet Take 0.125 mg by mouth as needed.  Marland Kitchen ketoconazole (NIZORAL) 2 % cream 2 (two) times daily as needed.   . meloxicam (MOBIC) 15 MG tablet TAKE 1 TABLET BY MOUTH EVERY DAY AS NEEDED WITH FOOD  . metoprolol tartrate (LOPRESSOR) 25 MG tablet Take  37.5 mg in the evening , may take an additional 25 mg tablet as needed daily  . montelukast (SINGULAIR) 10 MG tablet daily.   . pravastatin (PRAVACHOL) 40 MG tablet Take 80 mg by mouth daily.   Marland Kitchen zolpidem (AMBIEN) 10 MG tablet Take 5 mg by mouth at bedtime as needed for sleep.  . [DISCONTINUED] metoprolol tartrate (LOPRESSOR) 25 MG tablet Take  37.5 mg in the evening , may take an additional 25 mg tablet as needed daily  . [DISCONTINUED] metoprolol tartrate (LOPRESSOR) 25 MG tablet TAKE 1 TABLET BY MOUTH  TWICE A DAY  . [DISCONTINUED] metoprolol tartrate (LOPRESSOR) 25 MG tablet Take  37.5 mg in the evening , may take an additional 25 mg tablet as needed daily    Allergies  Allergen Reactions  . Latex     Excoriated skin  -   Tegaderm.  . Sulfa Antibiotics Hives  . Shrimp [Shellfish Allergy] Other (See Comments)  . Tegaderm Ag Mesh [Silver] Dermatitis   Social History   Tobacco Use  . Smoking status: Never Smoker  . Smokeless tobacco: Never Used  Substance Use Topics  . Alcohol use: Yes    Alcohol/week: 4.2 - 6.0 oz    Types: 7 - 10 Glasses of wine per week  . Drug use: No   Social History   Social History Narrative   She is a married mother of 2 children, no grandchildren. 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 She lives with her husband who is working on site in [place]. She has a BS/BSN.   She enjoys walking at least 50 minutes to an hour to 3 days a week. When she doesn't do it is because either too hot, too cold or she just  feels too lazy.    family history includes Alcohol abuse in her brother; Bipolar disorder in her father; Breast cancer in her mother; Cancer in her father, mother, and sister; Coronary artery disease (age of onset: 12) in her mother; Dementia in her father; Diabetes in her sister; Hypercholesterolemia in her sister; Hyperlipidemia in her mother; Hypertension in her father and mother; Leukemia (age of onset: 31) in her maternal grandmother; Scleroderma in her sister.  Wt Readings from Last 3 Encounters:  10/01/17 152 lb 6.4 oz (69.1 kg)  09/15/17 152 lb (68.9 kg)  08/26/17 143 lb (64.9 kg)    PHYSICAL EXAM BP 125/82   Pulse 67   Ht 5\' 4"  (1.626 m)   Wt 152 lb 6.4 oz (69.1 kg)   LMP 08/08/2013   BMI 26.16 kg/m   Physical Exam  Constitutional: She is oriented to person, place, and time. She appears well-developed and well-nourished. No distress.  Healthy-appearing.  Well-groomed.  HENT:  Head: Normocephalic and atraumatic.  Eyes: No scleral icterus.  Neck: Normal range of motion. Neck supple. No hepatojugular reflux and no JVD present. Carotid bruit is not present.  Cardiovascular: Normal rate, regular rhythm, normal heart sounds and intact distal pulses.  No extrasystoles are present. PMI is not displaced. Exam reveals no gallop and no friction rub.  No murmur heard. Musculoskeletal: Normal range of motion. She exhibits no edema.  Neurological: She is alert and oriented to person, place, and time.  Psychiatric: She has a normal mood and affect. Her behavior is normal. Judgment and thought content normal.  Vitals reviewed.   Adult ECG Report n/a  Other studies Reviewed: Additional studies/ records that were reviewed today include:  Recent Labs: January 11, 2017  TC 188, TG 105, HDL 58, LDL 109; A1c 6.0 --> since starting Pravachol August 25, 2016: TC 2017, TG 168, LDL 121, HDL 63,  --> Pravachol dose recently increased.  ASSESSMENT / PLAN: Problem List Items Addressed This Visit     Hyperlipidemia, mixed (Chronic)    She had started on statin, but her labs actually look worse.  I am not sure the pravastatin will be as effective.  She has not done well with either simvastatin or rosuvastatin or atorvastatin for the past. I think the only other statin that would be available to  help would be Livalo.  Livalo will also be less likely to be associated with potential memory issues seen with other statins. If she does take statin holiday --by holding Pravachol, and if symptoms improved, would want to recommend switching to Livalo.      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  Pretty well controlled on beta-blocker.  Basically taking a standing dose at night and then d on an as-needed basis osing herself during the day with 12.5 to 25 mg.  On this regimen she seems to be pretty well controlled.      Relevant Medications   metoprolol tartrate (LOPRESSOR) 25 MG tablet   Family history of premature CAD (Chronic)    History of CAD and evidence of CAD on coronary CTA but mild disease. This would simply recommend cardiac risk factor modification try to drive LDL down closer to 70 as opposed 100.  Blood pressure control etc.  Coronary CTA  not showing any significant CAD correlates well with CPX finding      Essential hypertension (Chronic)    Blood pressure is pretty well controlled with beta-blocker.  She is now back on HCTZ but mostly on a as needed basis. -       Relevant Medications   metoprolol tartrate (LOPRESSOR) 25 MG tablet   DOE (dyspnea on exertion)    With CPX results and coronary CTA, not likely to be cardiac in nature.  Probably more related to be deconditioning.  There may be some component of chronotropic incompetence with beta-blocker, however I think we are stuck controlling her  PACs with a beta-blocker.           Current medicines are reviewed at length with the patient today. (+/- concerns) n questions about beta-blocker dosing/a The following changes have been made:See below  Patient Instructions  MEDICATION INSTRUCTIONS  IF  PRIMARY HAVE YOU DO A STATIN HOLIDAY WITH PRAVACHOL , WOULD SUGGEST CHANGE TO LIVALO.   NO OTHER CHANGES   Your physician wants you to follow-up in April 2020 [place].You will receive a reminder letter in the mail two months in advance. If you don't receive a letter, please call our office to schedule the follow-up appointment.   If you need a refill on your cardiac medications before your next appointment, please call your pharmacy.    Studies Ordered:   No orders of the defined types were placed in this encounter.     Glenetta Hew, M.D., M.S. Interventional Cardiologist   Pager # [phone] Phone # [phone] [address] [place], [place] [address]

## 2017-10-01 NOTE — Patient Instructions (Addendum)
MEDICATION INSTRUCTIONS  IF  PRIMARY HAVE YOU DO A STATIN HOLIDAY WITH PRAVACHOL , WOULD SUGGEST CHANGE TO LIVALO.   NO OTHER CHANGES   Your physician wants you to follow-up in April 2020 [place].You will receive a reminder letter in the mail two months in advance. If you don't receive a letter, please call our office to schedule the follow-up appointment.   If you need a refill on your cardiac medications before your next appointment, please call your pharmacy.

## 2017-10-04 ENCOUNTER — Encounter: Payer: Self-pay | Admitting: Cardiology

## 2017-10-04 NOTE — Assessment & Plan Note (Signed)
History of CAD and evidence of CAD on coronary CTA but mild disease. This would simply recommend cardiac risk factor modification try to drive LDL down closer to 70 as opposed 100.  Blood pressure control etc.  Coronary CTA  not showing any significant CAD correlates well with CPX finding

## 2017-10-04 NOTE — Assessment & Plan Note (Addendum)
Blood pressure is pretty well controlled with beta-blocker.  She is now back on HCTZ but mostly on a as needed basis. -

## 2017-10-04 NOTE — Assessment & Plan Note (Addendum)
With CPX results and coronary CTA, not likely to be cardiac in nature.  Probably more related to be deconditioning.  There may be some component of chronotropic incompetence with beta-blocker, however I think we are stuck controlling her PACs with a beta-blocker.

## 2017-10-04 NOTE — Assessment & Plan Note (Signed)
She had started on statin, but her labs actually look worse.  I am not sure the pravastatin will be as effective.  She has not done well with either simvastatin or rosuvastatin or atorvastatin for the past. I think the only other statin that would be available to help would be Livalo.  Livalo will also be less likely to be associated with potential memory issues seen with other statins. If she does take statin holiday --by holding Pravachol, and if symptoms improved, would want to recommend switching to Livalo.

## 2017-10-04 NOTE — Assessment & Plan Note (Signed)
Frequent PACs with occasional PAT noted on monitor.  Pretty well controlled on beta-blocker.  Basically taking a standing dose at night and then d on an as-needed basis osing herself during the day with 12.5 to 25 mg.  On this regimen she seems to be pretty well controlled.

## 2017-10-12 ENCOUNTER — Other Ambulatory Visit: Payer: Self-pay

## 2017-10-26 ENCOUNTER — Encounter: Payer: Self-pay | Admitting: Cardiology

## 2017-10-26 MED ORDER — METOPROLOL TARTRATE 25 MG PO TABS: ORAL_TABLET | ORAL | 3 refills | Status: DC

## 2017-10-28 ENCOUNTER — Other Ambulatory Visit: Payer: Self-pay | Admitting: *Deleted

## 2017-10-28 MED ORDER — HYDROCHLOROTHIAZIDE 25 MG PO TABS: 25.0000 mg | ORAL_TABLET | ORAL | 2 refills | Status: DC | PRN

## 2017-11-02 ENCOUNTER — Other Ambulatory Visit: Payer: Self-pay

## 2017-12-10 ENCOUNTER — Other Ambulatory Visit: Payer: Self-pay | Admitting: Cardiology

## 2018-07-22 ENCOUNTER — Telehealth: Payer: Self-pay | Admitting: Cardiology

## 2018-07-22 NOTE — Telephone Encounter (Signed)
Called and LVM for patient to call and schedule appt with Dr. Ellyn Hack.

## 2018-08-16 ENCOUNTER — Telehealth: Payer: Self-pay | Admitting: *Deleted

## 2018-08-16 NOTE — Telephone Encounter (Signed)
A message was left, re: follow up visit. 

## 2018-09-08 ENCOUNTER — Other Ambulatory Visit: Payer: Self-pay | Admitting: Family Medicine

## 2018-09-08 DIAGNOSIS — Z1231 Encounter for screening mammogram for malignant neoplasm of breast: Secondary | ICD-10-CM

## 2018-09-09 ENCOUNTER — Telehealth: Payer: Self-pay | Admitting: Certified Nurse Midwife

## 2018-09-09 ENCOUNTER — Other Ambulatory Visit: Payer: Self-pay | Admitting: Certified Nurse Midwife

## 2018-09-09 DIAGNOSIS — Z780 Asymptomatic menopausal state: Secondary | ICD-10-CM

## 2018-09-09 DIAGNOSIS — N951 Menopausal and female climacteric states: Secondary | ICD-10-CM

## 2018-09-09 NOTE — Telephone Encounter (Signed)
Patient has AEX scheduled 10-05-18. She has order for BMD but it expires this months. [facility]. Patient needs a new order--okay to send new order? Route to provider

## 2018-09-09 NOTE — Telephone Encounter (Signed)
Order entered for [facility] for [facility].

## 2018-09-09 NOTE — Telephone Encounter (Signed)
Patient sent the following correspondence through [place]. Routing to triage to assist patient with request.  Hi, Kristina Delgado.  Hope you are well.  It just occurred to me that I did not do a Bone Scan last year...and that order expires June 26th. They do not have any appointments available until July.  Would you please put in another order?  Many thanks and see you at the end of July,  Kristina Delgado.

## 2018-09-09 NOTE — Telephone Encounter (Signed)
Ok for new order for BMD

## 2018-09-28 IMAGING — CT CT HEART MORP W/ CTA COR W/ SCORE W/ CA W/CM &/OR W/O CM
4 of 7 series · 8 of 20 positions shown, 9 images · non-contrast
Comparison: Calcium score examination 02/02/2017.

EXAM:
OVER-READ INTERPRETATION  CT CHEST

The following report is an over-read performed by radiologist Dr.
over-read does not include interpretation of cardiac or coronary
anatomy or pathology. The coronary calcium score and cardiac CTA
interpretation by the cardiologist is attached.
CLINICAL DATA: 61-year-old female with h/o hyperlipidemia, PACs,
family h/o premature CAD and dyspnea on exertion.
Cardiac/Coronary  CT
TECHNIQUE: The patient was scanned on a Phillips Force scanner.

[Series 6: best diast 70 % · axial · 0.29mm/px · z∈[+1103,+1144]mm · 2 of 306 slices shown, 3 images]
[im 102/306  vessel]
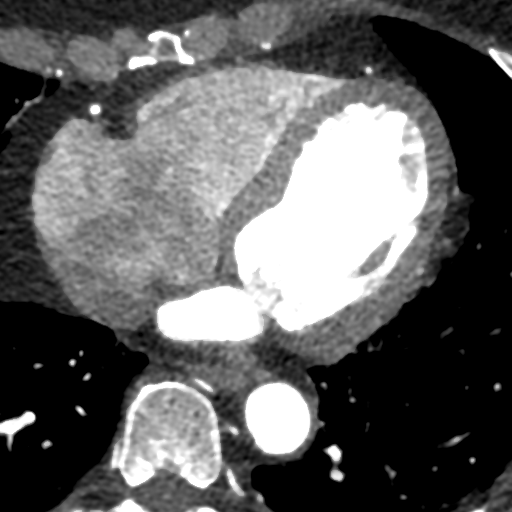
[im 102/306  lung]
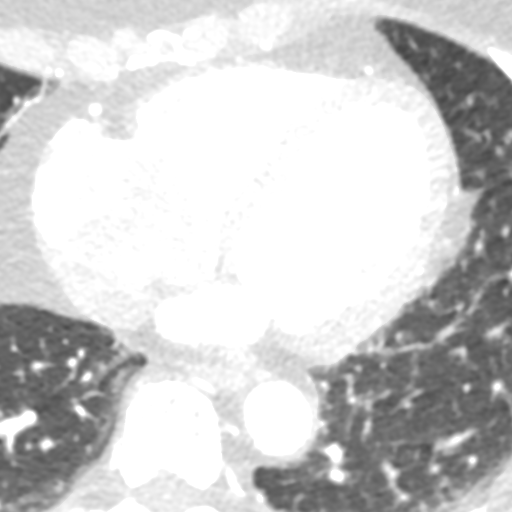
[im 204/306  vessel]
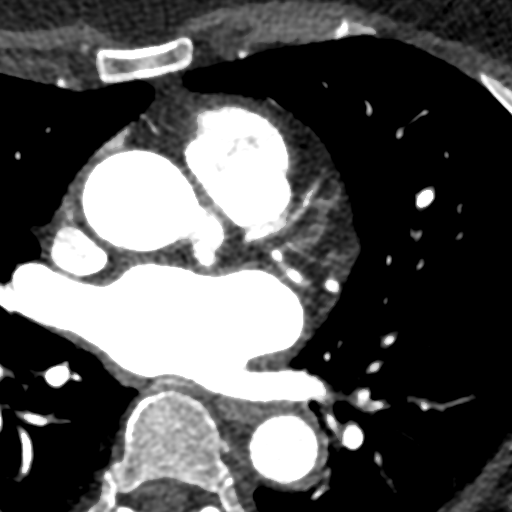

[Series 7: best syst 43 % · axial · 0.29mm/px · z∈[+1103,+1144]mm · 2 of 306 slices shown]
[im 102/306  vessel]
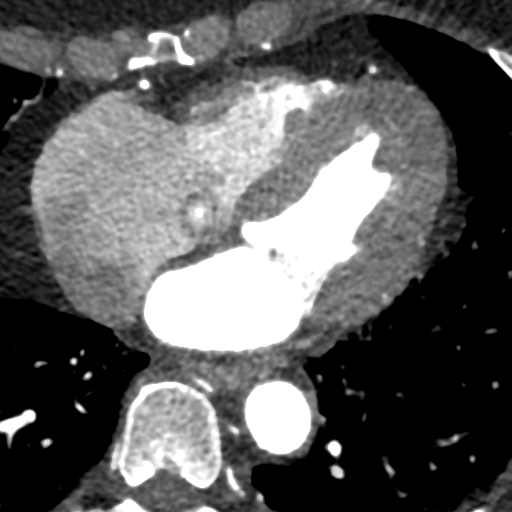
[im 204/306  vessel]
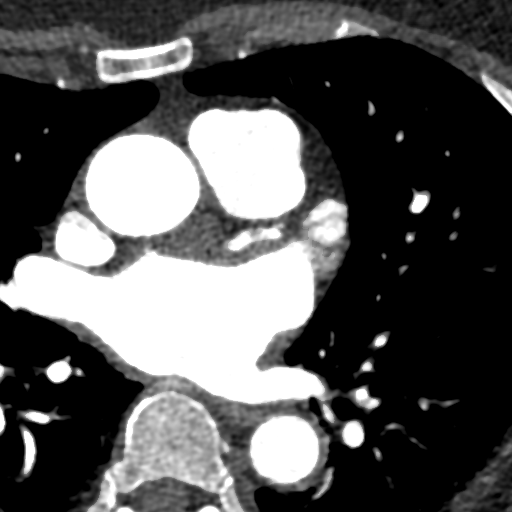

[Series 8: ts diast sharp 70 % · axial · 0.29mm/px · z∈[+1103,+1144]mm · 2 of 306 slices shown]
[im 102/306  lung]
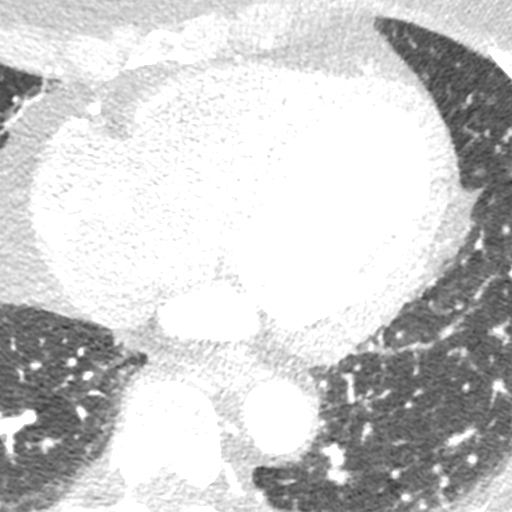
[im 204/306  lung]
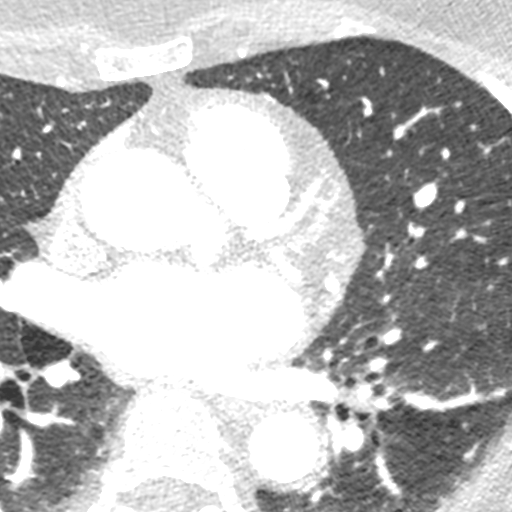

[Series 9: ts syst sharp 43 % · axial · 0.29mm/px · z∈[+1103,+1144]mm · 2 of 306 slices shown]
[im 102/306  lung]
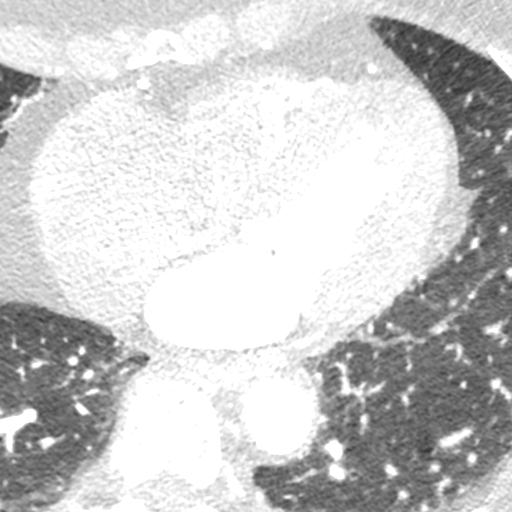
[im 204/306  lung]
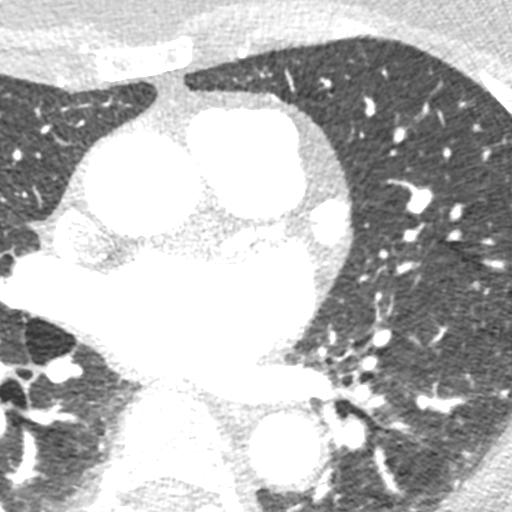

[8 of 20 positions shown; findings below may reference images not displayed]

FINDINGS: Aortic atherosclerosis. Within the visualized portions of the thorax
there are no suspicious appearing pulmonary nodules or masses, there
is no acute consolidative airspace disease, no pleural effusions, no
pneumothorax and no lymphadenopathy. Visualized portions of the
upper abdomen are unremarkable. There are no aggressive appearing
lytic or blastic lesions noted in the visualized portions of the
skeleton.
IMPRESSION: 1.  Aortic Atherosclerosis (ORP1C-KSE.E).
FINDINGS: A 120 kV prospective scan was triggered in the descending thoracic
aorta at 111 HU's. Axial non-contrast 3 mm slices were carried out
through the heart. The data set was analyzed on a dedicated work
station and scored using the Agatson method. Gantry rotation speed
was 250 msecs and collimation was .6 mm. No beta blockade and 0.8 mg
of sl NTG was given. The 3D data set was reconstructed in 5%
intervals of the 67-82 % of the R-R cycle. Diastolic phases were
analyzed on a dedicated work station using MPR, MIP and VRT modes.
The patient received 80 cc of contrast.

Aorta:  Normal size.  No calcifications.  No dissection.

Aortic Valve:  Trileaflet.  No calcifications.

Coronary Arteries:  Normal coronary origin.  Right dominance.

RCA is a large dominant artery that gives rise to PDA and PLA. There
is minimal calcified in the mid RCA.

Left main is a large artery that gives rise to LAD and LCX arteries.
Left main has no plaque.

LAD is a large vessel that gives rise to a small diagonal artery and
has mild predominantly calcified plaque in the mid LAD with stenosis
25-50%.

LCX is a non-dominant artery that gives rise to one large OM1
branch. There is no plaque.

Other findings:

Normal pulmonary vein drainage into the left atrium.

Normal let atrial appendage without a thrombus.

Normal size of the pulmonary artery.
IMPRESSION: 1. Coronary calcium score of 90. This was 58 percentile for age and
sex matched control.

2. Normal coronary origin with right dominance.

3. Mild non-obstructive CAD in the mid LAD with stenosis 25-50% and
minimal disease in the proximal RCA. Aggressive risk factor
modifications is recommended.

## 2018-10-05 ENCOUNTER — Telehealth: Payer: Self-pay | Admitting: Certified Nurse Midwife

## 2018-10-05 ENCOUNTER — Ambulatory Visit: Payer: 59 | Admitting: Certified Nurse Midwife

## 2018-10-05 NOTE — Progress Notes (Deleted)
63 y.o. [id number] Married  Caucasian Fe here for annual exam.    Patient's last menstrual period was 08/08/2013.          Sexually active: {yes no:314532}  The current method of family planning is vasectomy.    Exercising: {yes no:314532}  {types:19826} Smoker:  {YES NO:22349}  ROS  Health Maintenance: Pap:  09-17-16 neg History of Abnormal Pap: yes MMG:  09-15-17 category b density birads 1:neg Self Breast exams: {YES NO:22349} Colonoscopy:  2016 normal BMD:   2016 f/u 3-54yrs TDaP:  2013 Shingles: 2019 Pneumonia: had done Hep C and HIV: hep c neg 2017 Labs: ***   reports that she has never smoked. She has never used smokeless tobacco. She reports current alcohol use of about 7.0 - 10.0 standard drinks of alcohol per week. She reports that she does not use drugs.  Past Medical History:  Diagnosis Date  . Abnormal Pap smear of cervix    yrs ago  . GERD (gastroesophageal reflux disease)    Followed by Dr. Oletta Lamas. Plan to repeat EGD: In 2021  . Heart palpitations   . Hyperlipidemia   . Hypertension    previous hx  . IBS (irritable bowel syndrome)   . Malignant melanoma ([place]) 2006   History  . Migraine   . Rectal leakage   . Simple endometrial hyperplasia without atypia 08/18/2013  . Skin cancer 09/30/2004   malignant melanoma.    Past Surgical History:  Procedure Laterality Date  . CARDIOPULMONARY EXERCISE TEST  09/02/2017   No suggestion of any pulmonary or circulatory limitation to exercise with normal functional capacity but there is concern for EKG changes there were still nondiagnostic.  Recommendation was to check a coronary CT angiogram  . CERVICAL CERCLAGE    . CHOLECYSTECTOMY  1980  . CORONARY CT ANGIOGRAM  09/2017    Aortic atherosclerosis noted.  Calcium score 90.  Mild nonobstructive disease in the LAD less than 50%.  Minimal disease in the RCA.  Recommend aggressive risk factor modification.  . ENDOMETRIAL BIOPSY  2010   negative atypia/hyperplasia  .  Holter Monitor  06/2016   24 - ([facility], [place]) close to 119,000 beats. Minimum heart rate 66, average 84, maximum 143 of sinus rhythm. No PVCs noted no VT noted. Just over 16,000 supraventricular contractions/PACs noted. 3 episodes of greater than 10 beats paroxysmal atrial tachycardia (PAT - with max rate of 170 bpm). --> Higher incidence of PACs noted in 19 hours.  Marland Kitchen MELANOMA EXCISION  2006  . OTHER SURGICAL HISTORY     full buttock revision  . SHOULDER ARTHROSCOPY     left 2011/right 2012  . TRANSTHORACIC ECHOCARDIOGRAM  06/23/2016   [facility] ([place]): Normal LV size and function. Hyperdynamic LV function (has EF of 82% recorded) mild aortic regurgitation and mild mitral regurgitation. Mild/borderline grade 1 diastolic dysfunction. Otherwise normal wall thickness.  . WRIST SURGERY     rt wrist    Current Outpatient Medications  Medication Sig Dispense Refill  . ALPRAZolam (XANAX) 0.25 MG tablet Take 0.25 mg by mouth as needed.    Marland Kitchen BEPREVE 1.5 % SOLN 2 (two) times daily as needed.     . desoximetasone (TOPICORT) 0.05 % cream Apply 1 application topically 2 (two) times daily.    Marland Kitchen dexlansoprazole (DEXILANT) 60 MG capsule Take 60 mg by mouth as needed.     Marland Kitchen DYMISTA 137-50 MCG/ACT SUSP 2 (two) times daily as needed.     Marland Kitchen glycopyrrolate (  ROBINUL) 2 MG tablet Take 1 mg by mouth daily.     . hydrochlorothiazide (HYDRODIURIL) 25 MG tablet Take 1 tablet (25 mg total) by mouth as needed. 90 tablet 2  . hyoscyamine (LEVSIN, ANASPAZ) 0.125 MG tablet Take 0.125 mg by mouth as needed.    Marland Kitchen ketoconazole (NIZORAL) 2 % cream 2 (two) times daily as needed.     . meloxicam (MOBIC) 15 MG tablet TAKE 1 TABLET BY MOUTH EVERY DAY AS NEEDED WITH FOOD  3  . metoprolol tartrate (LOPRESSOR) 25 MG tablet TAKE 1 TABLET BY MOUTH TWICE A DAY 180 tablet 1  . montelukast (SINGULAIR) 10 MG tablet daily.     . pravastatin (PRAVACHOL) 40 MG tablet Take 80 mg by mouth daily.     Marland Kitchen zolpidem  (AMBIEN) 10 MG tablet Take 5 mg by mouth at bedtime as needed for sleep.     No current facility-administered medications for this visit.     Family History  Problem Relation Age of Onset  . Breast cancer Mother   . Cancer Mother        bladder cancer,  precancerous moles.  . Hypertension Mother   . Coronary artery disease Mother 56       PCI 3 - angina  . Hyperlipidemia Mother   . Cancer Sister        has atypical moles.  . Diabetes Sister   . Scleroderma Sister   . Hypercholesterolemia Sister   . Cancer Father        bladder  . Hypertension Father   . Bipolar disorder Father   . Dementia Father   . Alcohol abuse Brother   . Leukemia Maternal Grandmother 48    ROS:  Pertinent items are noted in HPI.  Otherwise, a comprehensive ROS was negative.  Exam:   LMP 08/08/2013    Ht Readings from Last 3 Encounters:  10/01/17 5\' 4"  (1.626 m)  09/15/17 5' 4.25" (1.632 m)  08/26/17 5' 4.5" (1.638 m)    General appearance: alert, cooperative and appears stated age Head: Normocephalic, without obvious abnormality, atraumatic Neck: no adenopathy, supple, symmetrical, trachea midline and thyroid {EXAM; THYROID:18604} Lungs: clear to auscultation bilaterally Breasts: {Exam; breast:13139::"normal appearance, no masses or tenderness"} Heart: regular rate and rhythm Abdomen: soft, non-tender; no masses,  no organomegaly Extremities: extremities normal, atraumatic, no cyanosis or edema Skin: Skin color, texture, turgor normal. No rashes or lesions Lymph nodes: Cervical, supraclavicular, and axillary nodes normal. No abnormal inguinal nodes palpated Neurologic: Grossly normal   Pelvic: External genitalia:  no lesions              Urethra:  normal appearing urethra with no masses, tenderness or lesions              Bartholin's and Skene's: normal                 Vagina: normal appearing vagina with normal color and discharge, no lesions              Cervix: {exam; cervix:14595}               Pap taken: {yes no:314532} Bimanual Exam:  Uterus:  {exam; uterus:12215}              Adnexa: {exam; adnexa:12223}               Rectovaginal: Confirms               Anus:  normal sphincter tone, no  lesions  Chaperone present: ***  A:  Well Woman with normal exam  P:   Reviewed health and wellness pertinent to exam  Pap smear: {YES NO:22349}  {plan; gyn:5269::"mammogram","pap smear","return annually or prn"}  An After Visit Summary was printed and given to the patient.

## 2018-10-05 NOTE — Telephone Encounter (Signed)
Patient [facility] today's appointment. Recall sent.

## 2018-10-06 ENCOUNTER — Other Ambulatory Visit: Payer: Self-pay

## 2018-10-11 ENCOUNTER — Ambulatory Visit (INDEPENDENT_AMBULATORY_CARE_PROVIDER_SITE_OTHER): Payer: 59 | Admitting: Certified Nurse Midwife

## 2018-10-11 ENCOUNTER — Encounter: Payer: Self-pay | Admitting: Certified Nurse Midwife

## 2018-10-11 ENCOUNTER — Other Ambulatory Visit: Payer: Self-pay

## 2018-10-11 ENCOUNTER — Other Ambulatory Visit (HOSPITAL_COMMUNITY)
Admission: RE | Admit: 2018-10-11 | Discharge: 2018-10-11 | Disposition: A | Discharge status: Home / Self Care | Payer: 59 | Source: Ambulatory Visit | Attending: Certified Nurse Midwife | Admitting: Certified Nurse Midwife

## 2018-10-11 VITALS — BP 110/80 | HR 70 | Temp 97.2°F | Resp 16 | Ht 64.25 in | Wt 147.0 lb

## 2018-10-11 DIAGNOSIS — Z01419 Encounter for gynecological examination (general) (routine) without abnormal findings: Secondary | ICD-10-CM

## 2018-10-11 DIAGNOSIS — Z124 Encounter for screening for malignant neoplasm of cervix: Secondary | ICD-10-CM | POA: Diagnosis not present

## 2018-10-11 DIAGNOSIS — Z0000 Encounter for general adult medical examination without abnormal findings: Secondary | ICD-10-CM | POA: Diagnosis not present

## 2018-10-11 DIAGNOSIS — Z780 Asymptomatic menopausal state: Secondary | ICD-10-CM

## 2018-10-11 LAB — POCT URINALYSIS DIPSTICK
Bilirubin, UA: NEGATIVE
Glucose, UA: NEGATIVE
Ketones, UA: NEGATIVE
Leukocytes, UA: NEGATIVE
Nitrite, UA: NEGATIVE
Protein, UA: NEGATIVE
Urobilinogen, UA: NEGATIVE E.U./dL — AB
pH, UA: 5 (ref 5.0–8.0)

## 2018-10-11 NOTE — Progress Notes (Signed)
63 y.o. [id number] Married  Caucasian Fe here for annual exam. Menopausal no vaginal bleeding or vaginal dryness. Returned to [place] in April. Aware she has lost weight, but eating well. Continues to see PCP for hypertension, asthma,cholesterol, labs and medication stable. Cholesterol has improved per patient. Living in [place] majority of the time, due spouse in [employer]. Will be returning soon. No other health issues today.  Patient's last menstrual period was 08/08/2013.          Sexually active: Yes.    The current method of family planning is vasectomy.    Exercising: Yes.    walking Smoker:  no  Review of Systems  Constitutional: Negative.        Low libido  HENT: Negative.   Eyes: Negative.   Respiratory: Negative.   Cardiovascular: Negative.   Gastrointestinal: Negative.   Genitourinary: Negative.   Musculoskeletal: Negative.   Skin: Negative.   Neurological: Negative.   Endo/Heme/Allergies: Negative.   Psychiatric/Behavioral: Negative.     Health Maintenance: Pap:  09-17-16 neg History of Abnormal Pap: yrs ago MMG:  09-15-17 category b density birads 1:neg, scheduled for next week Self Breast exams: occ Colonoscopy:  2016 normal BMD:   2016 f/u 3-23yrs, scheduled for next week TDaP: 2013 Shingles: 2019 Pneumonia: had done Hep C and HIV: hep c neg 2017 Labs: poct urine-rbc tr   reports that she has never smoked. She has never used smokeless tobacco. She reports current alcohol use of about 7.0 - 10.0 standard drinks of alcohol per week. She reports that she does not use drugs.  Past Medical History:  Diagnosis Date  . Abnormal Pap smear of cervix    yrs ago  . GERD (gastroesophageal reflux disease)    Followed by Dr. Oletta Lamas. Plan to repeat EGD: In 2021  . Heart palpitations   . Hyperlipidemia   . Hypertension    previous hx  . IBS (irritable bowel syndrome)   . Malignant melanoma ([place]) 2006   History  . Migraine   . Rectal leakage   . Simple endometrial hyperplasia  without atypia 08/18/2013  . Skin cancer 09/30/2004   malignant melanoma.    Past Surgical History:  Procedure Laterality Date  . CARDIOPULMONARY EXERCISE TEST  09/02/2017   No suggestion of any pulmonary or circulatory limitation to exercise with normal functional capacity but there is concern for EKG changes there were still nondiagnostic.  Recommendation was to check a coronary CT angiogram  . CERVICAL CERCLAGE    . CHOLECYSTECTOMY  1980  . CORONARY CT ANGIOGRAM  09/2017    Aortic atherosclerosis noted.  Calcium score 90.  Mild nonobstructive disease in the LAD less than 50%.  Minimal disease in the RCA.  Recommend aggressive risk factor modification.  . ENDOMETRIAL BIOPSY  2010   negative atypia/hyperplasia  . Holter Monitor  06/2016   24 - ([facility], [place]) close to 119,000 beats. Minimum heart rate 66, average 84, maximum 143 of sinus rhythm. No PVCs noted no VT noted. Just over 16,000 supraventricular contractions/PACs noted. 3 episodes of greater than 10 beats paroxysmal atrial tachycardia (PAT - with max rate of 170 bpm). --> Higher incidence of PACs noted in 19 hours.  Marland Kitchen MELANOMA EXCISION  2006  . OTHER SURGICAL HISTORY     full buttock revision  . SHOULDER ARTHROSCOPY     left 2011/right 2012  . TRANSTHORACIC ECHOCARDIOGRAM  06/23/2016   [facility] ([place]): Normal LV size and function. Hyperdynamic LV function (has  EF of 82% recorded) mild aortic regurgitation and mild mitral regurgitation. Mild/borderline grade 1 diastolic dysfunction. Otherwise normal wall thickness.  . WRIST SURGERY     rt wrist    Current Outpatient Medications  Medication Sig Dispense Refill  . ALPRAZolam (XANAX) 0.25 MG tablet Take 0.25 mg by mouth as needed.    Marland Kitchen BEPREVE 1.5 % SOLN 2 (two) times daily as needed.     Marland Kitchen dexlansoprazole (DEXILANT) 60 MG capsule Take 60 mg by mouth as needed.     Marland Kitchen DYMISTA 137-50 MCG/ACT SUSP 2 (two) times daily as needed.     Marland Kitchen glycopyrrolate  (ROBINUL) 2 MG tablet Take 1 mg by mouth daily.     . hydrochlorothiazide (HYDRODIURIL) 25 MG tablet Take 1 tablet (25 mg total) by mouth as needed. 90 tablet 2  . hyoscyamine (LEVSIN, ANASPAZ) 0.125 MG tablet Take 0.125 mg by mouth as needed.    . meloxicam (MOBIC) 15 MG tablet TAKE 1 TABLET BY MOUTH EVERY DAY AS NEEDED WITH FOOD  3  . metoprolol tartrate (LOPRESSOR) 25 MG tablet TAKE 1 TABLET BY MOUTH TWICE A DAY (Patient taking differently: Take 1.5) 180 tablet 1  . montelukast (SINGULAIR) 10 MG tablet daily.     . pravastatin (PRAVACHOL) 40 MG tablet Take 80 mg by mouth daily.     Marland Kitchen tretinoin (RETIN-A) 0.025 % cream as needed.     No current facility-administered medications for this visit.     Family History  Problem Relation Age of Onset  . Breast cancer Mother   . Cancer Mother        bladder cancer,  precancerous moles.  . Hypertension Mother   . Coronary artery disease Mother 67       PCI 3 - angina  . Hyperlipidemia Mother   . Cancer Sister        has atypical moles.  . Diabetes Sister   . Scleroderma Sister   . Hypercholesterolemia Sister   . Cancer Father        bladder  . Hypertension Father   . Bipolar disorder Father   . Dementia Father   . Alcohol abuse Brother   . Leukemia Maternal Grandmother 48    ROS:  Pertinent items are noted in HPI.  Otherwise, a comprehensive ROS was negative.  Exam:   BP 110/80   Pulse 70   Temp (!) 97.2 F (36.2 C) (Skin)   Resp 16   Ht 5' 4.25" (1.632 m)   Wt 147 lb (66.7 kg)   LMP 08/08/2013   BMI 25.04 kg/m  Height: 5' 4.25" (163.2 cm) Ht Readings from Last 3 Encounters:  10/11/18 5' 4.25" (1.632 m)  10/01/17 5\' 4"  (1.626 m)  09/15/17 5' 4.25" (1.632 m)    General appearance: alert, cooperative and appears stated age Head: Normocephalic, without obvious abnormality, atraumatic Neck: no adenopathy, supple, symmetrical, trachea midline and thyroid normal to inspection and palpation Lungs: clear to auscultation  bilaterally Breasts: normal appearance, no masses or tenderness, No nipple retraction or dimpling Heart: regular rate and rhythm Abdomen: soft, non-tender; no masses,  no organomegaly Extremities: extremities normal, atraumatic, no cyanosis or edema Skin: Skin color, texture, turgor normal. No rashes or lesions Lymph nodes: Cervical, supraclavicular, and axillary nodes normal. No abnormal inguinal nodes palpated Neurologic: Grossly normal   Pelvic: External genitalia:  no lesions              Urethra:  normal appearing urethra with no masses, tenderness or  lesions              Bartholin's and Skene's: normal                 Vagina: normal appearing vagina with normal color and discharge, no lesions              Cervix: no cervical motion tenderness, no lesions and normal appearance              Pap taken: Yes.   Bimanual Exam:  Uterus:  normal size, contour, position, consistency, mobility, non-tender and anteflexed              Adnexa: normal adnexa and no mass, fullness, tenderness               Rectovaginal: Confirms               Anus:  normal sphincter tone, no lesions  Chaperone present: yes  A:  Well Woman with normal exam  Post menopausal no HRT  Hypertension/asthma/cholesterol with PCP management  Travel to [place] in the next few weeks    P:   Reviewed health and wellness pertinent to exam  Aware of need to advise if vaginal bleeding or vaginal dryness changes.  Continue follow up with PCP as indicated.  Discussed health issues with travel abroad, patient aware. Wished safe trip.  Pap smear: yes  counseled on breast self exam, mammography screening, adequate intake of calcium and vitamin D, diet and exercise, Kegel's exercises  return annually or prn  An After Visit Summary was printed and given to the patient.

## 2018-10-13 LAB — CYTOLOGY - PAP
Diagnosis: NEGATIVE
HPV: NOT DETECTED

## 2018-10-20 ENCOUNTER — Ambulatory Visit
Admission: RE | Admit: 2018-10-20 | Discharge: 2018-10-20 | Disposition: A | Discharge status: Home / Self Care | Payer: 59 | Source: Ambulatory Visit | Attending: Family Medicine | Admitting: Family Medicine

## 2018-10-20 ENCOUNTER — Ambulatory Visit
Admission: RE | Admit: 2018-10-20 | Discharge: 2018-10-20 | Disposition: A | Discharge status: Home / Self Care | Payer: 59 | Source: Ambulatory Visit | Attending: Certified Nurse Midwife | Admitting: Certified Nurse Midwife

## 2018-10-20 ENCOUNTER — Other Ambulatory Visit: Payer: Self-pay

## 2018-10-20 DIAGNOSIS — Z780 Asymptomatic menopausal state: Secondary | ICD-10-CM

## 2018-10-20 DIAGNOSIS — N951 Menopausal and female climacteric states: Secondary | ICD-10-CM

## 2018-10-20 DIAGNOSIS — Z1231 Encounter for screening mammogram for malignant neoplasm of breast: Secondary | ICD-10-CM

## 2019-02-09 ENCOUNTER — Ambulatory Visit: Payer: 59 | Admitting: Cardiology

## 2019-05-09 ENCOUNTER — Other Ambulatory Visit: Payer: Self-pay | Admitting: Family Medicine

## 2019-05-09 DIAGNOSIS — Z1231 Encounter for screening mammogram for malignant neoplasm of breast: Secondary | ICD-10-CM

## 2019-05-26 ENCOUNTER — Ambulatory Visit: Payer: 59

## 2019-06-05 ENCOUNTER — Encounter: Payer: Self-pay | Admitting: Certified Nurse Midwife

## 2019-06-05 ENCOUNTER — Ambulatory Visit (INDEPENDENT_AMBULATORY_CARE_PROVIDER_SITE_OTHER): Payer: 59 | Admitting: Certified Nurse Midwife

## 2019-06-05 ENCOUNTER — Other Ambulatory Visit: Payer: Self-pay

## 2019-06-05 VITALS — BP 110/76 | HR 68 | Temp 97.0°F | Resp 16 | Ht 64.25 in | Wt 147.0 lb

## 2019-06-05 DIAGNOSIS — Z780 Asymptomatic menopausal state: Secondary | ICD-10-CM | POA: Diagnosis not present

## 2019-06-05 DIAGNOSIS — N951 Menopausal and female climacteric states: Secondary | ICD-10-CM

## 2019-06-05 DIAGNOSIS — Z01419 Encounter for gynecological examination (general) (routine) without abnormal findings: Secondary | ICD-10-CM

## 2019-06-05 NOTE — Progress Notes (Signed)
64 y.o. [id number] Married  Caucasian Fe here for annual exam. Post menopausal denies vaginal bleeding. Patient has taken Covid vaccine first injection. No side effects as of yet. Has scheduled colonoscopy for next month. Has noted some vaginal dryness and small hemorrhoid, but no bleeding or constipation.  [place] Sees her PCP for asthma, cholesterol and hypertension medication management, all stable.. Still living abroad due to spouse in [employer], but in the [place] for short while to take care of her exams. No other health issues today.  Patient's last menstrual period was 08/08/2013.          Sexually active: Yes.    The current method of family planning is vasectomy.    Exercising: Yes.    walking Smoker:  no  Review of Systems  Constitutional: Negative.   HENT: Negative.   Eyes: Negative.   Respiratory: Negative.   Cardiovascular: Negative.   Gastrointestinal: Negative.   Genitourinary: Negative.   Musculoskeletal: Negative.   Skin: Negative.   Neurological: Negative.   Endo/Heme/Allergies: Negative.   Psychiatric/Behavioral: Negative.     Health Maintenance: Pap:  09-17-16 neg, 10-11-2018 neg HPV HR neg History of Abnormal Pap: yrs ago MMG:  10-20-2018 category b density birads 1:neg Self Breast exams: occ Colonoscopy:  2016 normal, scheduled for 06/2019 BMD:   2020 TDaP:  2013 Shingles: 2019 Pneumonia: had done Hep C and HIV: hep c neg 2017 Labs: if needed.   reports that she has never smoked. She has never used smokeless tobacco. She reports current alcohol use of about 7.0 - 10.0 standard drinks of alcohol per week. She reports that she does not use drugs.  Past Medical History:  Diagnosis Date  . Abnormal Pap smear of cervix    yrs ago  . GERD (gastroesophageal reflux disease)    Followed by Dr. Oletta Lamas. Plan to repeat EGD: In 2021  . Heart palpitations   . Hyperlipidemia   . Hypertension    previous hx  . IBS (irritable bowel syndrome)   .  Malignant melanoma ([place]) 2006   History  . Migraine   . Rectal leakage   . Simple endometrial hyperplasia without atypia 08/18/2013  . Skin cancer 09/30/2004   malignant melanoma.    Past Surgical History:  Procedure Laterality Date  . CARDIOPULMONARY EXERCISE TEST  09/02/2017   No suggestion of any pulmonary or circulatory limitation to exercise with normal functional capacity but there is concern for EKG changes there were still nondiagnostic.  Recommendation was to check a coronary CT angiogram  . CERVICAL CERCLAGE    . CHOLECYSTECTOMY  1980  . CORONARY CT ANGIOGRAM  09/2017    Aortic atherosclerosis noted.  Calcium score 90.  Mild nonobstructive disease in the LAD less than 50%.  Minimal disease in the RCA.  Recommend aggressive risk factor modification.  . ENDOMETRIAL BIOPSY  2010   negative atypia/hyperplasia  . Holter Monitor  06/2016   24 - ([facility], [place]) close to 119,000 beats. Minimum heart rate 66, average 84, maximum 143 of sinus rhythm. No PVCs noted no VT noted. Just over 16,000 supraventricular contractions/PACs noted. 3 episodes of greater than 10 beats paroxysmal atrial tachycardia (PAT - with max rate of 170 bpm). --> Higher incidence of PACs noted in 19 hours.  Marland Kitchen MELANOMA EXCISION  2006  . OTHER SURGICAL HISTORY     full buttock revision  . SHOULDER ARTHROSCOPY     left 2011/right 2012  . TRANSTHORACIC ECHOCARDIOGRAM  06/23/2016  [facility] ([place]): Normal LV size and function. Hyperdynamic LV function (has EF of 82% recorded) mild aortic regurgitation and mild mitral regurgitation. Mild/borderline grade 1 diastolic dysfunction. Otherwise normal wall thickness.  . WRIST SURGERY     rt wrist    Current Outpatient Medications  Medication Sig Dispense Refill  . ALPRAZolam (XANAX) 0.25 MG tablet Take 0.25 mg by mouth as needed.    Marland Kitchen BEPREVE 1.5 % SOLN 2 (two) times daily as needed.     Marland Kitchen dexlansoprazole (DEXILANT) 60 MG capsule Take 60 mg  by mouth as needed.     Marland Kitchen DYMISTA 137-50 MCG/ACT SUSP 2 (two) times daily as needed.     Marland Kitchen glycopyrrolate (ROBINUL) 2 MG tablet Take 1 mg by mouth daily.     . hydrochlorothiazide (HYDRODIURIL) 25 MG tablet Take 1 tablet (25 mg total) by mouth as needed. 90 tablet 2  . hyoscyamine (LEVSIN, ANASPAZ) 0.125 MG tablet Take 0.125 mg by mouth as needed.    . meloxicam (MOBIC) 15 MG tablet TAKE 1 TABLET BY MOUTH EVERY DAY AS NEEDED WITH FOOD  3  . metoprolol tartrate (LOPRESSOR) 25 MG tablet TAKE 1 TABLET BY MOUTH TWICE A DAY (Patient taking differently: Take 1.5) 180 tablet 1  . montelukast (SINGULAIR) 10 MG tablet daily.     . pravastatin (PRAVACHOL) 40 MG tablet Take 80 mg by mouth daily.     Marland Kitchen tretinoin (RETIN-A) 0.025 % cream as needed.     No current facility-administered medications for this visit.    Family History  Problem Relation Age of Onset  . Breast cancer Mother   . Cancer Mother        bladder cancer,  precancerous moles.  . Hypertension Mother   . Coronary artery disease Mother 66       PCI 3 - angina  . Hyperlipidemia Mother   . Cancer Sister        has atypical moles.  . Diabetes Sister   . Scleroderma Sister   . Hypercholesterolemia Sister   . Cancer Father        bladder  . Hypertension Father   . Bipolar disorder Father   . Dementia Father   . Alcohol abuse Brother   . Leukemia Maternal Grandmother 48    ROS:  Pertinent items are noted in HPI.  Otherwise, a comprehensive ROS was negative.  Exam:   LMP 08/08/2013    Ht Readings from Last 3 Encounters:  10/11/18 5' 4.25" (1.632 m)  10/01/17 5\' 4"  (1.626 m)  09/15/17 5' 4.25" (1.632 m)    General appearance: alert, cooperative and appears stated age Head: Normocephalic, without obvious abnormality, atraumatic Neck: no adenopathy, supple, symmetrical, trachea midline and thyroid normal to inspection and palpation Lungs: clear to auscultation bilaterally Breasts: normal appearance, no masses or  tenderness, No nipple retraction or dimpling, No nipple discharge or bleeding, No axillary or supraclavicular adenopathy, Normal to palpation without dominant masses Heart: regular rate and rhythm Abdomen: soft, non-tender; no masses,  no organomegaly Extremities: extremities normal, atraumatic, no cyanosis or edema Skin: Skin color, texture, turgor normal. No rashes or lesions Lymph nodes: Cervical, supraclavicular, and axillary nodes normal. No abnormal inguinal nodes palpated Neurologic: Grossly normal   Pelvic: External genitalia:  no lesions, slight atrophic appearance              Urethra:  normal appearing urethra with no masses, tenderness or lesions  Bartholin's and Skene's: normal                 Vagina: Atrophic appearing vagina with normal color and very little discharge, no lesions              Cervix: no cervical motion tenderness and no lesions              Pap taken: No. Bimanual Exam:  Uterus:  normal size, contour, position, consistency, mobility, non-tender and anteverted              Adnexa: normal adnexa and no mass, fullness, tenderness               Rectovaginal: Confirms               Anus:  normal sphincter tone, no lesions  Chaperone present: yes  A:  Well Woman with normal exam  Post menopausal no HRT  Atrophic vaginitis  Hypertension, asthma, cholesterol with PCP management  P:   Reviewed health and wellness pertinent to exam  Aware of need to advise if vaginal bleeding  Discussed atrophic vaginitis and need to use some type of moisture for vaginal health. Recommended trial of coconut oil with instructions given for 2-3 times weekly at hs. Patient will advise if no change.  Continue follow up with PCP as indicated.  Pap smear: no  counseled on breast self exam, mammography screening, feminine hygiene, menopause, adequate intake of calcium and vitamin D, diet and exercise  return annually or prn  An After Visit Summary was printed and given  to the patient.

## 2019-06-05 NOTE — Patient Instructions (Signed)

## 2019-06-13 ENCOUNTER — Encounter: Payer: Self-pay | Admitting: Certified Nurse Midwife

## 2019-06-16 DIAGNOSIS — H2513 Age-related nuclear cataract, bilateral: Secondary | ICD-10-CM | POA: Insufficient documentation

## 2019-06-26 ENCOUNTER — Ambulatory Visit (INDEPENDENT_AMBULATORY_CARE_PROVIDER_SITE_OTHER): Payer: 59 | Admitting: Cardiology

## 2019-06-26 ENCOUNTER — Ambulatory Visit: Payer: 59

## 2019-06-26 ENCOUNTER — Other Ambulatory Visit: Payer: Self-pay

## 2019-06-26 ENCOUNTER — Encounter: Payer: Self-pay | Admitting: Cardiology

## 2019-06-26 VITALS — BP 120/82 | HR 77 | Ht 64.0 in | Wt 149.0 lb

## 2019-06-26 DIAGNOSIS — I10 Essential (primary) hypertension: Secondary | ICD-10-CM

## 2019-06-26 DIAGNOSIS — R0609 Other forms of dyspnea: Secondary | ICD-10-CM

## 2019-06-26 DIAGNOSIS — R0600 Dyspnea, unspecified: Secondary | ICD-10-CM | POA: Diagnosis not present

## 2019-06-26 DIAGNOSIS — I480 Paroxysmal atrial fibrillation: Secondary | ICD-10-CM | POA: Diagnosis not present

## 2019-06-26 DIAGNOSIS — E782 Mixed hyperlipidemia: Secondary | ICD-10-CM

## 2019-06-26 MED ORDER — DABIGATRAN ETEXILATE MESYLATE 150 MG PO CAPS: 150.0000 mg | ORAL_CAPSULE | Freq: Two times a day (BID) | ORAL | 3 refills | Status: DC

## 2019-06-26 MED ORDER — DABIGATRAN ETEXILATE MESYLATE 150 MG PO CAPS: 150.0000 mg | ORAL_CAPSULE | Freq: Two times a day (BID) | ORAL | 0 refills | Status: DC

## 2019-06-26 NOTE — Progress Notes (Signed)
Primary Care Provider: Marda Stalker, PA-C Cardiologist: Glenetta Hew, MD Electrophysiologist: None  Clinic Note: No chief complaint on file.    HPI:    Kristina Delgado is a 64 y.o. female with a PMH below who presents today for what amounts to be almost 2-year follow-up for history of PACs chest discomfort with off-and-on edema.  Kristina Delgado was initially evaluated in [place] by cardiologist while living there with her husband working as an [employer].    We have evaluated her with a CPX that was read as normal but the abnormal EKG.  Referred for coronary CT angiogram in July 2019.  Kristina Delgado was last seen on October 01, 2017 to follow-up for coronary CT angiogram of the studies.  She is actually doing well.  Was happy had results of her studies.  She had adjusted her beta-blocker such that she was taking 1/2 tab in the morning and 1 full tab in the evening of metoprolol.  Palpitations had notably improved.  We discussed Kardia monitor to follow her palpitations while back in pain.  Recent Hospitalizations: None  Reviewed  CV studies:    The following studies were reviewed today: (if available, images/films reviewed: From Epic Chart or Care Everywhere) . I personally reviewed her smart phone APP for Kardia Monitor results and saw 3 consecutive recordings during the episode in April 2019 clearly showing irregularly irregular rhythm with no clear P waves consistent with A. fib.:   Interval History:   Kristina Delgado returns here today for follow-up since they are in town.  She actually did purchase the [place] monitor and she and her husband both used it documenting evidence of atrial fibrillation for both of them.  She has been doing fairly well for the last year or so.  She actually had about a 5 to 6-hour episode back in August 2019 where she felt pretty tired and fatigued had really bad palpitations.  Her heart rate went to as high as 136 bpm but average about 118.   Was very irregular.  She checked herself on the there portable monitor and it suggested possible A. fib.  She has had about 3-4 other spells that have been much less prolonged or pronounced -> she did not record these episodes because they were not very long lasting.  She says overall, the palpitations that she has had notably reduced with taking metoprolol.  When she had the prolonged spell basically she took an extra dose of metoprolol and then the heart rate went down from 136 down to 118 bpm.  It all started in the morning about 11 AM.  Heart rate went down about an hour after taking the metoprolol and then she went to take a nap, and then woke up by 7 PM when she woke up, the spell resolved.  She feels palpitations but denies any chest tightness or pressure associated with it.  Maybe a little bit short of breath when the heart rate was fast and little bit dizzy, but no syncope or near syncope.  CV Review of Symptoms (Summary) doing relatively well as of now.  No prolonged palpitation episodes in over a year. Cardiovascular ROS: positive for - irregular heartbeat, palpitations and Associated with shortness of breath negative for - chest pain, edema, orthopnea, paroxysmal nocturnal dyspnea or Syncope/near syncope, TIA shows amaurosis fugax, claudication  The patient does not have symptoms concerning for COVID-19 infection (fever, chills, cough, or new shortness of breath).  The patient is practicing social  distancing & Masking.   She has completed both of her COVID-19 vaccine injections.  REVIEWED OF SYSTEMS   Review of Systems  Constitutional: Negative for malaise/fatigue (Obviously has not been as active since COVID-19, therefore does have some deconditioning related fatigue.) and weight loss.  HENT: Negative for congestion and nosebleeds.   Respiratory: Negative for shortness of breath.   Cardiovascular: Negative for leg swelling.  Gastrointestinal: Negative for blood in stool, heartburn  and melena.  Genitourinary: Negative for hematuria.  Musculoskeletal: Positive for back pain. Negative for falls and joint pain.  Neurological: Positive for dizziness (Only with palpitations.). Negative for focal weakness and weakness.  Psychiatric/Behavioral: Negative for memory loss. The patient is not nervous/anxious and does not have insomnia.     I have reviewed and (if needed) personally updated the patient's problem list, medications, allergies, past medical and surgical history, social and family history.   PAST MEDICAL HISTORY   Past Medical History:  Diagnosis Date  . Abnormal Pap smear of cervix    yrs ago  . GERD (gastroesophageal reflux disease)    Followed by Dr. Oletta Lamas. Plan to repeat EGD: In 2021  . Heart palpitations   . Hyperlipidemia   . Hypertension    previous hx  . IBS (irritable bowel syndrome)   . Malignant melanoma ([place]) 2006   History  . Migraine   . PAC (premature atrial contraction)   . Rectal leakage   . Simple endometrial hyperplasia without atypia 08/18/2013  . Skin cancer 09/30/2004   malignant melanoma.    PAST SURGICAL HISTORY   Past Surgical History:  Procedure Laterality Date  . CARDIOPULMONARY EXERCISE TEST  09/02/2017   No suggestion of any pulmonary or circulatory limitation to exercise with normal functional capacity but there is concern for EKG changes there were still nondiagnostic.  Recommendation was to check a coronary CT angiogram  . CERVICAL CERCLAGE    . CHOLECYSTECTOMY  1980  . CORONARY CT ANGIOGRAM  09/2017    Aortic atherosclerosis noted.  Calcium score 90.  Mild nonobstructive disease in the LAD less than 50%.  Minimal disease in the RCA.  Recommend aggressive risk factor modification.  . ENDOMETRIAL BIOPSY  2010   negative atypia/hyperplasia  . Holter Monitor  06/2016   24 - ([facility], [place]) close to 119,000 beats. Minimum heart rate 66, average 84, maximum 143 of sinus rhythm. No PVCs noted no VT noted.  Just over 16,000 supraventricular contractions/PACs noted. 3 episodes of greater than 10 beats paroxysmal atrial tachycardia (PAT - with max rate of 170 bpm). --> Higher incidence of PACs noted in 19 hours.  Marland Kitchen MELANOMA EXCISION  2006  . OTHER SURGICAL HISTORY     full buttock revision  . SHOULDER ARTHROSCOPY     left 2011/right 2012  . TRANSTHORACIC ECHOCARDIOGRAM  06/23/2016   [facility] ([place]): Normal LV size and function. Hyperdynamic LV function (has EF of 82% recorded) mild aortic regurgitation and mild mitral regurgitation. Mild/borderline grade 1 diastolic dysfunction. Otherwise normal wall thickness.  . WRIST SURGERY     rt wrist    MEDICATIONS/ALLERGIES   Current Meds  Medication Sig  . ALPRAZolam (XANAX) 0.25 MG tablet Take 0.25 mg by mouth as needed.  . ALPRAZolam (XANAX) 0.25 MG tablet Take 0.25 mg by mouth at bedtime as needed for anxiety.  Marland Kitchen BEPREVE 1.5 % SOLN 2 (two) times daily as needed.   Marland Kitchen dexlansoprazole (DEXILANT) 60 MG capsule Take 60 mg by mouth as needed.   Marland Kitchen  DYMISTA 137-50 MCG/ACT SUSP 2 (two) times daily as needed.   Marland Kitchen glycopyrrolate (ROBINUL) 2 MG tablet Take 1 mg by mouth daily.   . hydrochlorothiazide (HYDRODIURIL) 25 MG tablet Take 25 mg by mouth daily. Take 25 mg one tablet by mouth daily  . hyoscyamine (LEVSIN, ANASPAZ) 0.125 MG tablet Take 0.125 mg by mouth as needed.  . meloxicam (MOBIC) 15 MG tablet TAKE 1 TABLET BY MOUTH EVERY DAY AS NEEDED WITH FOOD  . metoprolol tartrate (LOPRESSOR) 25 MG tablet Take 12.5 mg by mouth 2 (two) times daily.  . montelukast (SINGULAIR) 10 MG tablet daily.   . pravastatin (PRAVACHOL) 80 MG tablet Take 80 mg by mouth daily.    Allergies  Allergen Reactions  . Latex     Excoriated skin  -   Tegaderm.  . Sulfa Antibiotics Hives  . Other     tegaderm dermatitis  . Shrimp [Shellfish Allergy] Other (See Comments)    SOCIAL HISTORY/FAMILY HISTORY   Reviewed in Epic:  Pertinent findings:   Still living  in [place] with her husband.  [place] currently to complete celebration of one of their sons wedding.  Leaving town end of the month.  OBJCTIVE -PE, EKG, labs   Wt Readings from Last 3 Encounters:  06/26/19 149 lb (67.6 kg)  06/05/19 147 lb (66.7 kg)  10/11/18 147 lb (66.7 kg)    Physical Exam: BP 120/82   Pulse 77   Ht 5\' 4"  (1.626 m)   Wt 149 lb (67.6 kg)   LMP 08/08/2013   SpO2 98%   BMI 25.58 kg/m  Physical Exam  Constitutional: She is oriented to person, place, and time. She appears well-developed and well-nourished. No distress.  Healthy-appearing.  Well-groomed  HENT:  Head: Normocephalic and atraumatic.  Neck: No hepatojugular reflux and no JVD present. Carotid bruit is not present.  Cardiovascular: Normal rate, regular rhythm, normal heart sounds and intact distal pulses.  Occasional extrasystoles are present. PMI is not displaced. Exam reveals no gallop and no friction rub.  No murmur (No SEM heard.) heard. Pulmonary/Chest: Effort normal and breath sounds normal. No respiratory distress. She has no wheezes. She has no rales.  Musculoskeletal:        General: No edema. Normal range of motion.  Neurological: She is alert and oriented to person, place, and time.  Psychiatric: She has a normal mood and affect. Her behavior is normal. Judgment and thought content normal.  Vitals reviewed.    Adult ECG Report  Rate: 77 ;  Rhythm: normal sinus rhythm and Normal axis, intervals and durations.;   Narrative Interpretation: Normal EKG.  Recent Labs:   Aug 04, 2018: TC 193, TG 116, HDL 65, LDL 104.  A1c 5.6.  Hgb 13.4. Cr 0.69, K+ 4.5. No results found for: CHOL, HDL, LDLCALC, LDLDIRECT, TRIG, CHOLHDL Lab Results  Component Value Date   CREATININE 0.73 09/20/2017   BUN 13 09/20/2017   NA 136 09/20/2017   K 4.0 09/20/2017   CL 94 (L) 09/20/2017   CO2 29 09/20/2017   Lab Results  Component Value Date   TSH 1.697 09/26/2012    ASSESSMENT/PLAN    Problem List Items  Addressed This Visit    Hyperlipidemia, mixed (Chronic)    Was started on pravastatin 80 mg by her PCP.  Most recent labs as of last year looked pretty well controlled for her coronary calcium score results.      Relevant Medications   hydrochlorothiazide (HYDRODIURIL) 25 MG tablet  metoprolol tartrate (LOPRESSOR) 25 MG tablet   dabigatran (PRADAXA) 150 MG CAPS capsule   dabigatran (PRADAXA) 150 MG CAPS capsule   Essential hypertension (Chronic)    Blood pressure looks pretty well controlled with as needed HCTZ and standing dose beta-blocker.      Relevant Medications   hydrochlorothiazide (HYDRODIURIL) 25 MG tablet   metoprolol tartrate (LOPRESSOR) 25 MG tablet   dabigatran (PRADAXA) 150 MG CAPS capsule   dabigatran (PRADAXA) 150 MG CAPS capsule   Other Relevant Orders   EKG 12-Lead (Completed)   Paroxysmal atrial fibrillation (HCC) - Primary (Chronic)    We did not capture anything on initial evaluation couple years ago, but now she clearly has evidence of A. fib on her home Kardia portable monitor.  Symptomatic when she is in a, but does not have frequent episodes.  As such, would simply continue rate control with beta-blocker for her otherwise intermittent palpitations.  This patients CHA2DS2-VASc Score and unadjusted Ischemic Stroke Rate (% per year) is equal to 4.8 % stroke rate/year from a score of 4  Above score calculated as 1 point each if present [CHF, HTN, DM, Vascular=MI/PAD/Aortic Plaque, Age if 65-74, or Female];  2 points each if present [Age > 75, or Stroke/TIA/TE]  Plan:  Continue current dosing with metoprolol -> as needed additional dose for breakthrough spells of A. Fib.  Where she to have more frequent recurrences, would probably consider as needed flecainide breakthrough.  Initiate anticoagulation with Pradaxa (she brought cost differential for Pradaxa versus Xarelto or Eliquis-Pradaxa was $14, the other 2 were roughly $78)      Relevant Medications    hydrochlorothiazide (HYDRODIURIL) 25 MG tablet   metoprolol tartrate (LOPRESSOR) 25 MG tablet   dabigatran (PRADAXA) 150 MG CAPS capsule   dabigatran (PRADAXA) 150 MG CAPS capsule   DOE (dyspnea on exertion)    This seems to be right now more of a deconditioning issue.  Seems to have relatively good heart rate responsiveness on her monitor.  Continue to recommend increased activity.  Not having of A. Fib to cause significant symptoms.      Relevant Orders   EKG 12-Lead (Completed)       COVID-19 Education: The signs and symptoms of COVID-19 were discussed with the patient and how to seek care for testing (follow up with PCP or arrange E-visit).   The importance of social distancing was discussed today.  I spent a total of 24minutes with the patient. >  50% of the time was spent in direct patient consultation.  Additional time spent with chart review  / charting (studies, outside notes, etc): 10 Total Time: 32 min   Current medicines are reviewed at length with the patient today.  (+/- concerns) n/a  Notice: This dictation was prepared with Dragon dictation along with smaller phrase technology. Any transcriptional errors that result from this process are unintentional and may not be corrected upon review.  Patient Instructions / Medication Changes & Studies & Tests Ordered   Patient Instructions  Medication Instructions:   TRY  Taking PRADAXA  150 mg  One tablet twice a day - if issue continue medication  *If you need a refill on your cardiac medications before your next appointment, please call your pharmacy*   Lab Work: NOT NEEDED.   Testing/Procedures: NOT NEEDED   Follow-Up: At [facility], you and your health needs are our priority.  As part of our continuing mission to provide you with exceptional heart care, we have created designated  Provider Care Teams.  These Care Teams include your primary Cardiologist (physician) and Advanced Practice Providers (APPs -   Physician Assistants and Nurse Practitioners) who all work together to provide you with the care you need, when you need it.    Your next appointment:   13  month(s) May 2022   The format for your next appointment:   In Person  Provider:   Glenetta Hew, MD   Other Instructions   Studies Ordered:   Orders Placed This Encounter  Procedures  . EKG 12-Lead     Glenetta Hew, M.D., M.S. Interventional Cardiologist   Pager # [phone] Phone # [phone] [address] [place], [place] [address]   Thank you for choosing Heartcare at [facility]!!

## 2019-06-26 NOTE — Patient Instructions (Addendum)
Medication Instructions:   TRY  Taking PRADAXA  150 mg  One tablet twice a day - if issue continue medication  *If you need a refill on your cardiac medications before your next appointment, please call your pharmacy*   Lab Work: NOT NEEDED.   Testing/Procedures: NOT NEEDED   Follow-Up: At [facility], you and your health needs are our priority.  As part of our continuing mission to provide you with exceptional heart care, we have created designated Provider Care Teams.  These Care Teams include your primary Cardiologist (physician) and Advanced Practice Providers (APPs -  Physician Assistants and Nurse Practitioners) who all work together to provide you with the care you need, when you need it.    Your next appointment:   13  month(s) May 2022   The format for your next appointment:   In Person  Provider:   Glenetta Hew, MD   Other Instructions

## 2019-06-26 NOTE — Telephone Encounter (Signed)
Sent message to patient for husband .

## 2019-06-27 ENCOUNTER — Encounter: Payer: Self-pay | Admitting: Cardiology

## 2019-06-27 DIAGNOSIS — I480 Paroxysmal atrial fibrillation: Secondary | ICD-10-CM | POA: Insufficient documentation

## 2019-06-27 NOTE — Assessment & Plan Note (Signed)
Was started on pravastatin 80 mg by her PCP.  Most recent labs as of last year looked pretty well controlled for her coronary calcium score results.

## 2019-06-27 NOTE — Assessment & Plan Note (Addendum)
We did not capture anything on initial evaluation couple years ago, but now she clearly has evidence of A. fib on her home Kardia portable monitor.  Symptomatic when she is in a, but does not have frequent episodes.  As such, would simply continue rate control with beta-blocker for her otherwise intermittent palpitations.  This patients CHA2DS2-VASc Score and unadjusted Ischemic Stroke Rate (% per year) is equal to 4.8 % stroke rate/year from a score of 4  Above score calculated as 1 point each if present [CHF, HTN, DM, Vascular=MI/PAD/Aortic Plaque, Age if 65-74, or Female];  2 points each if present [Age > 75, or Stroke/TIA/TE]  Plan:  Continue current dosing with metoprolol -> as needed additional dose for breakthrough spells of A. Fib.  Where she to have more frequent recurrences, would probably consider as needed flecainide breakthrough.  Initiate anticoagulation with Pradaxa (she brought cost differential for Pradaxa versus Xarelto or Eliquis-Pradaxa was $14, the other 2 were roughly $78)

## 2019-06-27 NOTE — Assessment & Plan Note (Signed)
Blood pressure looks pretty well controlled with as needed HCTZ and standing dose beta-blocker.

## 2019-06-27 NOTE — Assessment & Plan Note (Signed)
This seems to be right now more of a deconditioning issue.  Seems to have relatively good heart rate responsiveness on her monitor.  Continue to recommend increased activity.  Not having of A. Fib to cause significant symptoms.

## 2019-06-29 NOTE — Telephone Encounter (Signed)
Labs reviewed.  I had these with me during his visit.  The confusing thing was that the labs were identical to the 3 years ago but I was not sure if they were accurate.  Kidney function is fine for pre-CT.  Glenetta Hew, MD

## 2019-07-04 ENCOUNTER — Telehealth: Payer: Self-pay | Admitting: *Deleted

## 2019-07-04 MED ORDER — RIVAROXABAN 20 MG PO TABS: 20.0000 mg | ORAL_TABLET | Freq: Every day | ORAL | 3 refills | Status: DC

## 2019-07-04 NOTE — Telephone Encounter (Signed)
Patient in office with husband .  Patient spoke to Dr Ellyn Hack. Patient would like to switch to Xarelto from Pradaxa. It maybe easier to obtain  [place]  Dr Ellyn Hack states it okay to give her new prescription for Xarelto  So she may switch . Patient is aware to stop Pradaxa, if she start Xarelto.  written Prescription and savings card given  To patient .

## 2019-07-12 NOTE — Telephone Encounter (Signed)
Pt calling to see if MD has received lab work from pcp to address cholesterol. Will forward to MD.

## 2019-07-12 NOTE — Telephone Encounter (Signed)
Patient states she is calling to follow up in regards to medication changes.

## 2019-07-13 NOTE — Telephone Encounter (Signed)
Sent pt aonther email to have this refaxed

## 2019-07-14 ENCOUNTER — Ambulatory Visit: Payer: 59

## 2019-07-21 ENCOUNTER — Other Ambulatory Visit: Payer: Self-pay | Admitting: Cardiology

## 2019-07-24 NOTE — Telephone Encounter (Signed)
4/13 phone note pt was changed from Pradaxa to Xarelto, Pradaxa was never removed from med list. This has been done and refill denied.

## 2019-07-24 NOTE — Telephone Encounter (Signed)
Pt has both xarelto and pradaxa on med list. Will need a pharmd to clarify which one the pt is supposed to be on.... will route to the pharmd pool

## 2019-12-08 DIAGNOSIS — Z7901 Long term (current) use of anticoagulants: Secondary | ICD-10-CM | POA: Insufficient documentation

## 2019-12-08 DIAGNOSIS — D6869 Other thrombophilia: Secondary | ICD-10-CM | POA: Insufficient documentation

## 2020-08-01 ENCOUNTER — Other Ambulatory Visit: Payer: Self-pay

## 2020-08-01 MED ORDER — RIVAROXABAN 20 MG PO TABS: 20.0000 mg | ORAL_TABLET | Freq: Every day | ORAL | 1 refills | Status: DC

## 2020-08-01 NOTE — Telephone Encounter (Signed)
89f, 67.6kg, Creatinine, Serum 0.620 mg/ 07/05/2019, lovw/harding 06/26/19 (OVERDUE), CCR 112.2KG

## 2020-09-03 ENCOUNTER — Ambulatory Visit: Payer: 59

## 2020-11-01 ENCOUNTER — Telehealth: Payer: Self-pay | Admitting: Cardiology

## 2020-11-01 ENCOUNTER — Other Ambulatory Visit: Payer: Self-pay | Admitting: Family Medicine

## 2020-11-01 DIAGNOSIS — I480 Paroxysmal atrial fibrillation: Secondary | ICD-10-CM

## 2020-11-01 DIAGNOSIS — Z1231 Encounter for screening mammogram for malignant neoplasm of breast: Secondary | ICD-10-CM

## 2020-11-01 DIAGNOSIS — E782 Mixed hyperlipidemia: Secondary | ICD-10-CM

## 2020-11-01 NOTE — Telephone Encounter (Signed)
New Message:       Patient is calling about her appointment.

## 2020-11-01 NOTE — Telephone Encounter (Signed)
Spoke to patient. She is calling to schedule appointment for her and her husband.  Both will be in the town from [place]. Appointment made for both .

## 2020-11-06 ENCOUNTER — Other Ambulatory Visit: Payer: Self-pay | Admitting: *Deleted

## 2020-11-06 DIAGNOSIS — E782 Mixed hyperlipidemia: Secondary | ICD-10-CM

## 2020-11-06 DIAGNOSIS — I480 Paroxysmal atrial fibrillation: Secondary | ICD-10-CM

## 2020-11-06 NOTE — Addendum Note (Signed)
Addended by: Raiford Simmonds on: 11/06/2020 04:55 PM   Modules accepted: Orders

## 2020-11-06 NOTE — Telephone Encounter (Signed)
Can have labs done prior to appointment if needed- lipid , CMP  Lab ordered.

## 2020-11-21 NOTE — Telephone Encounter (Signed)
I completely agree with Jinny Blossom.  Glenetta Hew, MD

## 2020-12-04 ENCOUNTER — Other Ambulatory Visit: Payer: Self-pay

## 2020-12-04 ENCOUNTER — Ambulatory Visit
Admission: RE | Admit: 2020-12-04 | Discharge: 2020-12-04 | Disposition: A | Discharge status: Home / Self Care | Payer: 59 | Source: Ambulatory Visit | Attending: Family Medicine | Admitting: Family Medicine

## 2020-12-04 DIAGNOSIS — Z1231 Encounter for screening mammogram for malignant neoplasm of breast: Secondary | ICD-10-CM

## 2020-12-04 LAB — LIPID PANEL
Chol/HDL Ratio: 2.9 ratio (ref 0.0–4.4)
Cholesterol, Total: 211 mg/dL — ABNORMAL HIGH (ref 100–199)
HDL: 73 mg/dL (ref >39)
LDL Chol Calc (NIH): 125 mg/dL — ABNORMAL HIGH (ref 0–99)
Triglycerides: 72 mg/dL (ref 0–149)
VLDL Cholesterol Cal: 13 mg/dL (ref 5–40)

## 2020-12-04 LAB — COMPREHENSIVE METABOLIC PANEL
ALT: 33 IU/L — ABNORMAL HIGH (ref 0–32)
AST: 27 IU/L (ref 0–40)
Albumin/Globulin Ratio: 2.6 — ABNORMAL HIGH (ref 1.2–2.2)
Albumin: 5.2 g/dL — ABNORMAL HIGH (ref 3.8–4.8)
Alkaline Phosphatase: 29 IU/L — ABNORMAL LOW (ref 44–121)
BUN/Creatinine Ratio: 18 (ref 12–28)
BUN: 12 mg/dL (ref 8–27)
Bilirubin Total: 0.4 mg/dL (ref 0.0–1.2)
CO2: 24 mmol/L (ref 20–29)
Calcium: 10 mg/dL (ref 8.7–10.3)
Chloride: 93 mmol/L — ABNORMAL LOW (ref 96–106)
Creatinine, Ser: 0.67 mg/dL (ref 0.57–1.00)
Globulin, Total: 2 g/dL (ref 1.5–4.5)
Glucose: 134 mg/dL — ABNORMAL HIGH (ref 65–99)
Potassium: 4.3 mmol/L (ref 3.5–5.2)
Sodium: 133 mmol/L — ABNORMAL LOW (ref 134–144)
Total Protein: 7.2 g/dL (ref 6.0–8.5)
eGFR: 96 mL/min/{1.73_m2} (ref >59)

## 2020-12-05 ENCOUNTER — Encounter: Payer: Self-pay | Admitting: Cardiology

## 2020-12-05 ENCOUNTER — Ambulatory Visit (INDEPENDENT_AMBULATORY_CARE_PROVIDER_SITE_OTHER): Payer: 59 | Admitting: Cardiology

## 2020-12-05 VITALS — BP 130/80 | HR 66 | Ht 64.5 in | Wt 146.0 lb

## 2020-12-05 DIAGNOSIS — Z8249 Family history of ischemic heart disease and other diseases of the circulatory system: Secondary | ICD-10-CM

## 2020-12-05 DIAGNOSIS — I480 Paroxysmal atrial fibrillation: Secondary | ICD-10-CM

## 2020-12-05 DIAGNOSIS — E782 Mixed hyperlipidemia: Secondary | ICD-10-CM | POA: Diagnosis not present

## 2020-12-05 DIAGNOSIS — I10 Essential (primary) hypertension: Secondary | ICD-10-CM

## 2020-12-05 DIAGNOSIS — Z7901 Long term (current) use of anticoagulants: Secondary | ICD-10-CM

## 2020-12-05 MED ORDER — ROSUVASTATIN CALCIUM 10 MG PO TABS: 10.0000 mg | ORAL_TABLET | Freq: Every day | ORAL | 3 refills | Status: DC

## 2020-12-05 MED ORDER — RIVAROXABAN 20 MG PO TABS: 20.0000 mg | ORAL_TABLET | Freq: Every day | ORAL | 3 refills | Status: DC

## 2020-12-05 MED ORDER — HYDROCHLOROTHIAZIDE 25 MG PO TABS: 25.0000 mg | ORAL_TABLET | Freq: Every day | ORAL | 3 refills | Status: DC

## 2020-12-05 MED ORDER — METOPROLOL TARTRATE 25 MG PO TABS: 12.5000 mg | ORAL_TABLET | Freq: Two times a day (BID) | ORAL | 3 refills | Status: DC

## 2020-12-05 MED ORDER — FLECAINIDE ACETATE 100 MG PO TABS: ORAL_TABLET | ORAL | 3 refills | Status: DC

## 2020-12-05 NOTE — Patient Instructions (Signed)
Medication Instructions:   Take Flecainide 100 mg  2 tablets  if needed AFib take 100 mg 1 tablet 12 hrs later  and may take 100 mg 1 tablet 12 later if still in Afib Up to 4 doses   Continue all other medications  *If you need a refill on your cardiac medications before your next appointment, please call your pharmacy*   Lab Work: None ordered   Testing/Procedures: None ordered   Follow-Up: At [facility], you and your health needs are our priority.  As part of our continuing mission to provide you with exceptional heart care, we have created designated Provider Care Teams.  These Care Teams include your primary Cardiologist (physician) and Advanced Practice Providers (APPs -  Physician Assistants and Nurse Practitioners) who all work together to provide you with the care you need, when you need it.  We recommend signing up for the patient portal called "MyChart".  Sign up information is provided on this After Visit Summary.  MyChart is used to connect with patients for Virtual Visits (Telemedicine).  Patients are able to view lab/test results, encounter notes, upcoming appointments, etc.  Non-urgent messages can be sent to your provider as well.   To learn more about what you can do with MyChart, go to [email or web address].    Your next appointment:  6 months    Call in Jan to schedule March appointment     The format for your next appointment: Office    Provider:  Dr.Harding

## 2020-12-05 NOTE — Progress Notes (Signed)
Primary Care Provider: Marda Stalker, PA-C Cardiologist: Glenetta Hew, MD Electrophysiologist: None  Clinic Note: Chief Complaint  Patient presents with   Follow-up    15+ months.  Overall doing well   Atrial Fibrillation    Has had several short bursts of A. fib since last visit-see table below   Hyperlipidemia    Concerned about lipid panel being inadequately controlled, but also having some memory issues.  No myalgias.  Continues on rosuvastatin 10 mg   Hypertension    Reviewed blood pressure log   ===================================  ASSESSMENT/PLAN   Problem List Items Addressed This Visit       Cardiology Problems   Hyperlipidemia, mixed (Chronic)    She seems to be tolerating rosuvastatin 10 mg pretty well, but was little bit concerned about her lipid panel.  The LDL does appear to be better compared to April, but would like to potentially see the LDL less than 100.  Unfortunate, she is having some memory issues which may simply be related to all the stress of her moving.  She wants to see how things do stabilize out after the move to make any adjustments, but we talked about the possibility of doing a 1 month statin holiday just to determine if the statin is playing a role.  If so, would want to consider either switching back to pravastatin or considering another medication.      Relevant Medications   hydrochlorothiazide (HYDRODIURIL) 25 MG tablet   metoprolol tartrate (LOPRESSOR) 25 MG tablet   rivaroxaban (XARELTO) 20 MG TABS tablet   rosuvastatin (CRESTOR) 10 MG tablet   flecainide (TAMBOCOR) 100 MG tablet   Other Relevant Orders   EKG 12-Lead (Completed)   Essential hypertension (Chronic)    Her blood pressure logs actually pretty well controlled.  There was some hypertension spells but nothing overly significant.  For the most part ranges are in the 1 20-1 30 mils mercury for systolic blood pressures.    She is on Lopressor and HCTZ.  No change.       Relevant Medications   hydrochlorothiazide (HYDRODIURIL) 25 MG tablet   metoprolol tartrate (LOPRESSOR) 25 MG tablet   rivaroxaban (XARELTO) 20 MG TABS tablet   rosuvastatin (CRESTOR) 10 MG tablet   flecainide (TAMBOCOR) 100 MG tablet   Other Relevant Orders   EKG 12-Lead (Completed)   Paroxysmal atrial fibrillation (HCC) - Primary (Chronic)    She does have a home Kardia monitor which has been out of record some episodes of A. fib.  She does seem to be a little symptomatic but these episodes are not that long lasting all that frequent.  Since she did have a couple spells that were several hours, and is a bit concerned I have decided to continue on her beta-blocker but will also give flecainide as a pill in the pocket option.  Plan: Continue current dose of Lopressor and for short episodes okay to take additional 1/2 tablet PRN A. Fib. Continue Xarelto-refill provided. For breakthrough episodes of A. fib:Take Flecainide 100 mg  2 tablets  if needed AFib take 100 mg 1 tablet 12 hrs later  and may take 100 mg 1 tablet 12 later if still in Afib Up to 4 doses       Relevant Medications   hydrochlorothiazide (HYDRODIURIL) 25 MG tablet   metoprolol tartrate (LOPRESSOR) 25 MG tablet   rivaroxaban (XARELTO) 20 MG TABS tablet   rosuvastatin (CRESTOR) 10 MG tablet   flecainide (TAMBOCOR) 100 MG tablet  Other Relevant Orders   EKG 12-Lead (Completed)     Other   Family history of premature CAD (Chronic)    Relatively normal's coronary CTA and CPX. Main target is controlling blood pressure and target LDL least less than 100.      Current use of long term anticoagulation (Chronic)    She is taking Xarelto 20 mg daily, tolerating well without any issues.  With her having very minimal breakthrough episodes of AF, it would be fine for her to hold Xarelto 48 to 72 hours preop for any of surgeries or procedures without bridging.  Simply restart when safe postop..       ===================================  HPI:    WILHELMENIA SORBO is a 65 y.o. female with a PMH notable for PAF and frequent PACs (on Lopressor and Pradaxa) along with HTN, and HLD who presents today for 70-monthfollow-up.  PBRITTNAY RINwas last seen on June 26, 2019 -> was using a Kardia home monitor that documented A. fib.  Episode lasted maybe 5 to 6 hours.  Heart rate ranged from 118 to 136 bpm.  She had about 3-4 other spells though not as prolonged.  She noted symptoms improved dramatically after starting metoprolol.  Recent Hospitalizations: None  Reviewed  CV studies:    The following studies were reviewed today: (if available, images/films reviewed: From Epic Chart or Care Everywhere) None:  Interval History:   PJAKYRIA RADDERreturns here today for follow-up.  She is back in town for about a month.  She had to come back earlier than originally expected because of [place]  He passed away in J08/15/24complications of alcohol.  They now have an upcoming wedding of a family member on her husband side.  Overall, she has been pretty stable she says has had about 7 or 8 spells where she felt A. fib, on.  She has taken the extra beta-blocker on a couple occasions, but not that much.  Otherwise, she says that she is feeling fine and not having major issues from cardiac standpoint.  Most of the episodes of A. fib occur in the early hours in the morning and oftentimes wake her up from sleep.  She is under quite a bit of stress now because she [place]  She is having pack up all of their life essentially and move everything back to this country where she does not have any real possessions.  This is been quite stressful for her.  In this setting, she has started noticing a little bit of "scatterbrained "sensation and feeling somewhat confused with some  memory issues.  We discussed the possibility of there being a statin concern.  She is tolerating the rosuvastatin from a joint pain standpoint.  She was little concerned because in April 2021 her LDL went up to 159 and total cholesterol 244.  She brings with her a readout of times when she has had A. fib or fast heart rates: Date Time Average rate; duration  11/22/2019 0400-woke her up 140; lasted about an hour  04/23/2020 0344 130; lasted about an hour and a half  07/12/2020 0801-0904 Ranged from 154-137; just over an hour  08/01/2020 0750 156; about 45 minutes  08/18/2020 0315 144; 45 minutes-1 hour  10/04/2020 [id number]; longest episode-almost 6-1/2 hours  10/13/2020  146  11/01/2020  148   She noticed the irregular heartbeats but no other untoward symptoms of chest pain, dizziness or dyspnea.  CV Review of Symptoms (Summary) Cardiovascular ROS: positive for - irregular heartbeat, palpitations, rapid heart rate, and describes 7-8 episodes of recurrent A. fib lasting maybe a few hours at the most.  Not associated with chest pain or dyspnea.  Scatterbrained/memory issues. negative for - chest pain, dyspnea on exertion, edema, orthopnea, paroxysmal nocturnal dyspnea, shortness of breath, or lightheadedness, dizziness or wooziness, syncope/near syncope or TIA/amaurosis fugax, claudication  REVIEWED OF SYSTEMS   Review of Systems  Constitutional:  Negative for malaise/fatigue and weight loss.  HENT:  Negative for congestion and nosebleeds.   Respiratory: Negative.    Cardiovascular:        Per HPI  Gastrointestinal:  Negative for abdominal pain, blood in stool and melena.  Genitourinary:  Negative for hematuria.  Musculoskeletal:  Positive for back pain and joint pain (Mild aches and pains but nothing overly significant.).  Neurological:  Positive for dizziness (Off and on, but nothing significant.). Negative for focal weakness and weakness.  Psychiatric/Behavioral:  Positive for memory  loss (Somewhat "sketchy memory of late ", but thinks it may be related to stress). Negative for depression. The patient is nervous/anxious (Anxious about her upcoming move).  . Back pain, dizziness with palpitations.  I have reviewed and (if needed) personally updated the patient's problem list, medications, allergies, past medical and surgical history, social and family history.   PAST MEDICAL HISTORY   Past Medical History:  Diagnosis Date   Abnormal Pap smear of cervix    yrs ago   GERD (gastroesophageal reflux disease)    Followed by Dr. Oletta Lamas. Plan to repeat EGD: In 2021   Heart palpitations    Hyperlipidemia    Hypertension    previous hx   IBS (irritable bowel syndrome)    Malignant melanoma ([place]) 2006   History   Migraine    PAC (premature atrial contraction)    Rectal leakage    Simple endometrial hyperplasia without atypia 08/18/2013   Skin cancer 09/30/2004   malignant melanoma.    PAST SURGICAL HISTORY   Past Surgical History:  Procedure Laterality Date   CARDIOPULMONARY EXERCISE TEST  09/02/2017   No suggestion of any pulmonary or circulatory limitation to exercise with normal functional capacity but there is concern for EKG changes there were still nondiagnostic.  Recommendation was to check a coronary CT angiogram   CERVICAL CERCLAGE     CHOLECYSTECTOMY  1980   CORONARY CT ANGIOGRAM  09/2017    Aortic atherosclerosis noted.  Calcium score 90.  Mild nonobstructive disease in the LAD less than 50%.  Minimal disease in the RCA.  Recommend aggressive risk factor modification.   ENDOMETRIAL BIOPSY  2010   negative atypia/hyperplasia   Holter Monitor  06/2016   24 - ([facility], [place]) close to 119,000 beats. Minimum heart rate 66, average 84, maximum 143 of sinus rhythm. No PVCs noted no VT noted. Just over 16,000 supraventricular contractions/PACs noted. 3 episodes of greater than 10 beats paroxysmal atrial tachycardia (PAT - with max rate of 170 bpm).  --> Higher incidence of PACs noted in 19 hours.   MELANOMA EXCISION  2006   OTHER SURGICAL HISTORY     full buttock revision   SHOULDER ARTHROSCOPY     left 2011/right 2012   TRANSTHORACIC ECHOCARDIOGRAM  06/23/2016   [facility] ([place]): Normal LV size and function. Hyperdynamic LV function (has EF  of 82% recorded) mild aortic regurgitation and mild mitral regurgitation. Mild/borderline grade 1 diastolic dysfunction. Otherwise normal wall thickness.   WRIST SURGERY     rt wrist    Immunization History  Administered Date(s) Administered   Influenza-Unspecified 11/22/2018   Moderna Sars-Covid-2 Vaccination 05/26/2019   Tdap 10/21/2011   Zoster Recombinat (Shingrix) 02/23/2018    MEDICATIONS/ALLERGIES   Current Meds  Medication Sig   ALPRAZolam (XANAX) 0.25 MG tablet Take 0.25 mg by mouth at bedtime as needed for anxiety.   BEPREVE 1.5 % SOLN 2 (two) times daily as needed.    dexlansoprazole (DEXILANT) 60 MG capsule Take 60 mg by mouth as needed.    DYMISTA 137-50 MCG/ACT SUSP 2 (two) times daily as needed.    flecainide (TAMBOCOR) 100 MG tablet Take 100 mg  2 tablets if needed for Afib then take 100 mg 1 tablet 12 hours later and may take 100 mg 1 tablet 12 hr later if still in Afib   glycopyrrolate (ROBINUL) 2 MG tablet Take 1 mg by mouth daily.    hyoscyamine (LEVSIN, ANASPAZ) 0.125 MG tablet Take 0.25 mg by mouth as needed. Takes 0.250 MG PRN   montelukast (SINGULAIR) 10 MG tablet daily.    [DISCONTINUED] hydrochlorothiazide (HYDRODIURIL) 25 MG tablet Take 25 mg by mouth daily. Take 25 mg one tablet by mouth daily   [DISCONTINUED] metoprolol tartrate (LOPRESSOR) 25 MG tablet Take 12.5 mg by mouth 2 (two) times daily.   [DISCONTINUED] rivaroxaban (XARELTO) 20 MG TABS tablet Take 1 tablet (20 mg total) by mouth daily with supper.   [DISCONTINUED] rosuvastatin (CRESTOR) 10 MG tablet Take 10 mg by mouth daily.    Allergies  Allergen Reactions   Latex     Excoriated  skin  -   Tegaderm.   Sulfa Antibiotics Hives   Other     tegaderm dermatitis   Shrimp [Shellfish Allergy] Other (See Comments)    SOCIAL HISTORY/FAMILY HISTORY   Reviewed in Epic:  Pertinent findings:  Social History   Tobacco Use   Smoking status: Never   Smokeless tobacco: Never  Substance Use Topics   Alcohol use: Yes    Alcohol/week: 7.0 - 10.0 standard drinks    Types: 7 - 10 Glasses of wine per week   Drug use: No   Social History   Social History Narrative   She is a married mother of 2 children, no grandchildren. 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 She lives with her husband who is working on site in [place]. She has a BS/BSN.   She enjoys walking at least 50 minutes to an hour to 3 days a week. When she doesn't do it is because either too hot, too cold or she just feels too lazy.    OBJCTIVE -PE, EKG, labs   Wt Readings from Last 3 Encounters:  12/05/20 146 lb (66.2 kg)  06/26/19 149 lb (67.6 kg)  06/05/19 147 lb (66.7 kg)    Physical Exam: BP 130/80 (BP Location: Right Arm)   Pulse 66   Ht 5' 4.5" (1.638 m)   Wt 146 lb (66.2 kg)   LMP 08/08/2013   SpO2 98%   BMI 24.67 kg/m  Physical Exam Vitals reviewed.  Constitutional:      General: She is not in acute distress.    Appearance: Normal appearance. She is normal weight. She is not ill-appearing (Healthy-appearing.  Well-groomed.) or toxic-appearing.  HENT:     Head: Normocephalic and atraumatic.  Neck:  Vascular: No carotid bruit or JVD.  Cardiovascular:     Rate and Rhythm: Normal rate and regular rhythm. Occasional Extrasystoles are present.    Chest Wall: PMI is not displaced.     Pulses: Normal pulses. No decreased pulses.     Heart sounds: Normal heart sounds. No murmur heard.   No friction rub. No gallop.  Pulmonary:     Effort: Pulmonary effort is normal. No respiratory distress.     Breath sounds: Normal breath sounds. No wheezing, rhonchi or rales.  Musculoskeletal:         General: No swelling. Normal range of motion.     Cervical back: Normal range of motion and neck supple.  Skin:    General: Skin is warm and dry.  Neurological:     General: No focal deficit present.     Mental Status: She is alert and oriented to person, place, and time.     Gait: Gait normal.  Psychiatric:        Mood and Affect: Mood normal.        Behavior: Behavior normal.        Thought Content: Thought content normal.        Judgment: Judgment normal.     Adult ECG Report  Rate: 66 ;  Rhythm: normal sinus rhythm and normal axis, intervals and durations. ;   Narrative Interpretation: Normal  Recent Labs:  reviewed  07/11/2019: TC 244, TG 106, HDL 65, LDL 159.   Lab Results  Component Value Date   CHOL 211 (H) 12/04/2020   HDL 73 12/04/2020   LDLCALC 125 (H) 12/04/2020   TRIG 72 12/04/2020   CHOLHDL 2.9 12/04/2020   Lab Results  Component Value Date   CREATININE 0.67 12/04/2020   BUN 12 12/04/2020   NA 133 (L) 12/04/2020   K 4.3 12/04/2020   CL 93 (L) 12/04/2020   CO2 24 12/04/2020   CBC Latest Ref Rng & Units 11/18/2015 10/23/2014 09/05/2013  WBC 3.9 - 10.3 10e3/uL 5.9 5.2 7.0  Hemoglobin 11.6 - 15.9 g/dL 12.7 13.1 13.1  Hematocrit 34.8 - 46.6 % 37.3 39.4 38.8  Platelets 145 - 400 10e3/uL 245 267 299    No results found for: HGBA1C Lab Results  Component Value Date   TSH 1.697 09/26/2012    ==================================================  COVID-19 Education: The signs and symptoms of COVID-19 were discussed with the patient and how to seek care for testing (follow up with PCP or arrange E-visit).    I spent a total of 38 minutes with the patient spent in direct patient consultation.  She had lots of questions and wanted to discuss multiple issues.  Reviewed blood pressure log which was relatively normal.  Very difficult to figure out how to do her prescriptions. Additional time spent with chart review  / charting (studies, outside notes, etc): 23  min Total Time: 61 min  Current medicines are reviewed at length with the patient today.  (+/- concerns) n/a  This visit occurred during the SARS-CoV-2 public health emergency.  Safety protocols were in place, including screening questions prior to the visit, additional usage of staff PPE, and extensive cleaning of exam room while observing appropriate contact time as indicated for disinfecting solutions.  Notice: This dictation was prepared with Dragon dictation along with smaller phrase technology. Any transcriptional errors that result from this process are unintentional and may not be corrected upon review.  Patient Instructions / Medication Changes & Studies & Tests Ordered  Patient Instructions  Medication Instructions:   Take Flecainide 100 mg  2 tablets  if needed AFib take 100 mg 1 tablet 12 hrs later  and may take 100 mg 1 tablet 12 later if still in Afib Up to 4 doses   Continue all other medications  *If you need a refill on your cardiac medications before your next appointment, please call your pharmacy*   Lab Work: None ordered   Testing/Procedures: None ordered   Follow-Up: At [facility], you and your health needs are our priority.  As part of our continuing mission to provide you with exceptional heart care, we have created designated Provider Care Teams.  These Care Teams include your primary Cardiologist (physician) and Advanced Practice Providers (APPs -  Physician Assistants and Nurse Practitioners) who all work together to provide you with the care you need, when you need it.  We recommend signing up for the patient portal called "MyChart".  Sign up information is provided on this After Visit Summary.  MyChart is used to connect with patients for Virtual Visits (Telemedicine).  Patients are able to view lab/test results, encounter notes, upcoming appointments, etc.  Non-urgent messages can be sent to your provider as well.   To learn more about what you can  do with MyChart, go to [email or web address].    Your next appointment:  6 months    Call in Jan to schedule March appointment     The format for your next appointment: Office    Provider:  Dr.Calie Buttrey     Studies Ordered:   Orders Placed This Encounter  Procedures   EKG 12-Lead     Glenetta Hew, M.D., M.S. Interventional Cardiologist   Pager # [phone] Phone # [phone] [address] [place], [place] [address]   Thank you for choosing [facility] at [facility]!!

## 2020-12-06 ENCOUNTER — Other Ambulatory Visit: Payer: Self-pay

## 2020-12-07 ENCOUNTER — Encounter: Payer: Self-pay | Admitting: Cardiology

## 2020-12-07 NOTE — Assessment & Plan Note (Signed)
Her blood pressure logs actually pretty well controlled.  There was some hypertension spells but nothing overly significant.  For the most part ranges are in the 1 20-1 30 mils mercury for systolic blood pressures.    She is on Lopressor and HCTZ.  No change.

## 2020-12-07 NOTE — Assessment & Plan Note (Signed)
Relatively normal's coronary CTA and CPX. Main target is controlling blood pressure and target LDL least less than 100.

## 2020-12-07 NOTE — Assessment & Plan Note (Signed)
She seems to be tolerating rosuvastatin 10 mg pretty well, but was little bit concerned about her lipid panel.  The LDL does appear to be better compared to April, but would like to potentially see the LDL less than 100.  Unfortunate, she is having some memory issues which may simply be related to all the stress of her moving.  She wants to see how things do stabilize out after the move to make any adjustments, but we talked about the possibility of doing a 1 month statin holiday just to determine if the statin is playing a role.  If so, would want to consider either switching back to pravastatin or considering another medication.

## 2020-12-07 NOTE — Assessment & Plan Note (Signed)
She does have a home Kardia monitor which has been out of record some episodes of A. fib.  She does seem to be a little symptomatic but these episodes are not that long lasting all that frequent.  Since she did have a couple spells that were several hours, and is a bit concerned I have decided to continue on her beta-blocker but will also give flecainide as a pill in the pocket option.  Plan:  Continue current dose of Lopressor and for short episodes okay to take additional 1/2 tablet PRN A. Fib.  Continue Xarelto-refill provided.  For breakthrough episodes of A. fib:Take Flecainide 100 mg  2 tablets  if needed AFib take 100 mg 1 tablet 12 hrs later  and may take 100 mg 1 tablet 12 later if still in Afib  Up to 4 doses

## 2020-12-07 NOTE — Assessment & Plan Note (Signed)
She is taking Xarelto 20 mg daily, tolerating well without any issues.   With her having very minimal breakthrough episodes of AF, it would be fine for her to hold Xarelto 48 to 72 hours preop for any of surgeries or procedures without bridging.  Simply restart when safe postop.Marland Kitchen

## 2020-12-10 ENCOUNTER — Ambulatory Visit (INDEPENDENT_AMBULATORY_CARE_PROVIDER_SITE_OTHER): Payer: 59 | Admitting: Nurse Practitioner

## 2020-12-10 ENCOUNTER — Other Ambulatory Visit: Payer: Self-pay

## 2020-12-10 ENCOUNTER — Encounter: Payer: Self-pay | Admitting: Nurse Practitioner

## 2020-12-10 VITALS — BP 120/74 | Ht 64.0 in | Wt 145.0 lb

## 2020-12-10 DIAGNOSIS — Z780 Asymptomatic menopausal state: Secondary | ICD-10-CM | POA: Diagnosis not present

## 2020-12-10 DIAGNOSIS — Z01419 Encounter for gynecological examination (general) (routine) without abnormal findings: Secondary | ICD-10-CM

## 2020-12-10 NOTE — Progress Notes (Signed)
   AZARIAH LATENDRESSE 1955-12-28 [id number]   History:  65 y.o. [id number] presents for annual exam. Postmenopausal - no HRT, no bleeding. Abnormal pap many years ago without intervention. HTN, HLD, asthma managed by PCP, 2006 melanoma., a fib managed by cardiology.  Gynecologic History Patient's last menstrual period was 08/08/2013.   Contraception/Family planning: post menopausal status Sexually active: Yes  Health Maintenance Last Pap: 10/11/2018. Results were: Normal, 5-year repeat Last mammogram: 12/04/2020. Results were: Normal Last colonoscopy: 2021? Results were: Normal, 5-year recall Last Dexa: 10/20/2018. Results were: T-score -1.0  Past medical history, past surgical history, family history and social history were all reviewed and documented in the EPIC chart. Lives in [place] x 16 years, planning to move back to [place] spring 2023. 2 sons.   ROS:  A ROS was performed and pertinent positives and negatives are included.  Exam:  Vitals:   12/10/20 1405  BP: 120/74  Weight: 145 lb (65.8 kg)  Height: 5\' 4"  (1.626 m)   Body mass index is 24.89 kg/m.  General appearance:  Normal Thyroid:  Symmetrical, normal in size, without palpable masses or nodularity. Respiratory  Auscultation:  Clear without wheezing or rhonchi Cardiovascular  Auscultation:  Regular rate, without rubs, murmurs or gallops  Edema/varicosities:  Not grossly evident Abdominal  Soft,nontender, without masses, guarding or rebound.  Liver/spleen:  No organomegaly noted  Hernia:  None appreciated  Skin  Inspection:  Grossly normal Breasts: Examined lying and sitting.   Right: Without masses, retractions, nipple discharge or axillary adenopathy.   Left: Without masses, retractions, nipple discharge or axillary adenopathy. Genitourinary   Inguinal/mons:  Normal without inguinal adenopathy  External genitalia:  Normal appearing vulva with no masses, tenderness, or lesions  BUS/Urethra/Skene's glands:   Normal  Vagina:  Atrophic changes  Cervix:  Normal appearing without discharge or lesions  Uterus:  Normal in size, shape and contour.  Midline and mobile, nontender  Adnexa/parametria:     Rt: Normal in size, without masses or tenderness.   Lt: Normal in size, without masses or tenderness.  Anus and perineum: Normal  Digital rectal exam: Normal sphincter tone without palpated masses or tenderness  Patient informed chaperone available to be present for breast and pelvic exam. Patient has requested no chaperone to be present. Patient has been advised what will be completed during breast and pelvic exam.   Assessment/Plan:  65 y.o. [id number] for annual exam.   Well female exam with routine gynecological exam - Education provided on SBEs, importance of preventative screenings, current guidelines, high calcium diet, regular exercise, and multivitamin daily.  Labs with PCP.   Postmenopausal - Plan: DG Bone Density. No HRT, no bleeding.   Screening for cervical cancer - Abnormal pap many years ago, no intervention required. Will repeat at 5-year interval per guidelines. We did discuss current guidelines and the option to stop screening. We will readdress next year.   Screening for breast cancer - Normal mammogram history.  Continue annual screenings.  Normal breast exam today.  Screening for colon cancer - 2021 colonoscopy. Will repeat at GI's recommended interval.    Return in 1 year for annual.   Tamela Gammon DNP, 2:33 PM 12/10/2020

## 2020-12-18 ENCOUNTER — Ambulatory Visit (INDEPENDENT_AMBULATORY_CARE_PROVIDER_SITE_OTHER): Payer: 59

## 2020-12-18 ENCOUNTER — Other Ambulatory Visit: Payer: Self-pay

## 2020-12-18 ENCOUNTER — Other Ambulatory Visit: Payer: Self-pay | Admitting: Nurse Practitioner

## 2020-12-18 DIAGNOSIS — Z780 Asymptomatic menopausal state: Secondary | ICD-10-CM

## 2020-12-18 DIAGNOSIS — Z13820 Encounter for screening for osteoporosis: Secondary | ICD-10-CM

## 2021-02-27 ENCOUNTER — Encounter: Payer: Self-pay | Admitting: Cardiology

## 2021-02-27 ENCOUNTER — Telehealth: Payer: Self-pay | Admitting: Cardiology

## 2021-02-27 MED ORDER — METOPROLOL TARTRATE 25 MG PO TABS: 12.5000 mg | ORAL_TABLET | Freq: Two times a day (BID) | ORAL | 3 refills | Status: DC

## 2021-02-27 MED ORDER — RIVAROXABAN 20 MG PO TABS: 20.0000 mg | ORAL_TABLET | Freq: Every day | ORAL | 3 refills | Status: DC

## 2021-02-27 MED ORDER — ROSUVASTATIN CALCIUM 10 MG PO TABS: 10.0000 mg | ORAL_TABLET | Freq: Every day | ORAL | 3 refills | Status: DC

## 2021-02-27 MED ORDER — HYDROCHLOROTHIAZIDE 25 MG PO TABS: 25.0000 mg | ORAL_TABLET | Freq: Every day | ORAL | 3 refills | Status: DC

## 2021-02-27 NOTE — Telephone Encounter (Signed)
Patient said both she and her Husband Christel Bai DOB 01/17/1955) are having difficulty getting their medications. They would like to speak to Ivin Booty as it is a complicated situation and she is familiar with their background. She also sent the office a MyChart message.  If there are any urgent questions Ivin Booty can call. She is in [place] and she is 14 hours ahead of [place].   She would like Ivin Booty to send her a MyChart message when the medication has been ordered

## 2021-02-27 NOTE — Telephone Encounter (Signed)
Sent message through [facility] to patient and husband   Hello Kristina Delgado   I sent the medication refills to [facility] as requested.   I did verify the are being process. You may have received another message from our office about the refill a nurse that was assisting, sent you this message. she reference that she gave you an appointment for April.    She did not realize  you do not live in the states at the present time. I will cancel appointment for June 23, 2021 for Kristina Delgado.  Kristina Delgado still as an appointment for March  27,2023.  If  he is not back in the sta[place] in March  contact the office  for we can rearange an appointment fo r and him.   Have a great day  Kristina Booty RN    Refilled patient  medication from [facility]    HCTZ 25 mg  Xarelto 20 mg Metoprolol tartrate 12.5  Rosuvastatin 10 mg  As requested.

## 2021-06-23 ENCOUNTER — Ambulatory Visit: Payer: 59 | Admitting: Cardiology

## 2022-02-03 ENCOUNTER — Ambulatory Visit: Payer: 59 | Attending: Cardiology | Admitting: Cardiology

## 2022-02-03 ENCOUNTER — Encounter: Payer: Self-pay | Admitting: Cardiology

## 2022-02-03 VITALS — BP 122/74 | HR 64 | Ht 64.0 in | Wt 153.4 lb

## 2022-02-03 DIAGNOSIS — Z7901 Long term (current) use of anticoagulants: Secondary | ICD-10-CM | POA: Diagnosis not present

## 2022-02-03 DIAGNOSIS — I10 Essential (primary) hypertension: Secondary | ICD-10-CM

## 2022-02-03 DIAGNOSIS — E782 Mixed hyperlipidemia: Secondary | ICD-10-CM

## 2022-02-03 DIAGNOSIS — I480 Paroxysmal atrial fibrillation: Secondary | ICD-10-CM | POA: Diagnosis not present

## 2022-02-03 MED ORDER — HYDROCHLOROTHIAZIDE 25 MG PO TABS: 25.0000 mg | ORAL_TABLET | Freq: Every day | ORAL | 3 refills | Status: DC

## 2022-02-03 MED ORDER — ROSUVASTATIN CALCIUM 10 MG PO TABS: 10.0000 mg | ORAL_TABLET | Freq: Every day | ORAL | 3 refills | Status: DC

## 2022-02-03 MED ORDER — RIVAROXABAN 20 MG PO TABS: 20.0000 mg | ORAL_TABLET | Freq: Every day | ORAL | 3 refills | Status: DC

## 2022-02-03 MED ORDER — METOPROLOL TARTRATE 25 MG PO TABS: 12.5000 mg | ORAL_TABLET | Freq: Two times a day (BID) | ORAL | 3 refills | Status: DC

## 2022-02-03 MED ORDER — FLECAINIDE ACETATE 100 MG PO TABS: ORAL_TABLET | ORAL | 3 refills | Status: DC

## 2022-02-03 NOTE — Patient Instructions (Signed)
Medication Instructions:   No changes except if you develop Afib you can take  up to 6 doses of Flecainide   12 hours apart, once  you break Afib  take 50 mg of flecainide 50 mg 2 dose 12 hours apart    *If you need a refill on your cardiac medications before your next appointment, please call your pharmacy*   Lab Work: Not needed .   Testing/Procedures:  Not needed  Follow-Up: At [facility], you and your health needs are our priority.  As part of our continuing mission to provide you with exceptional heart care, we have created designated Provider Care Teams.  These Care Teams include your primary Cardiologist (physician) and Advanced Practice Providers (APPs -  Physician Assistants and Nurse Practitioners) who all work together to provide you with the care you need, when you need it.     Your next appointment:   3 month(s)  The format for your next appointment:   In Person  Provider:   Glenetta Hew, MD    Other Instructions

## 2022-02-03 NOTE — Progress Notes (Signed)
Primary Care Provider: [facility] Cardiologist: Glenetta Hew, MD Electrophysiologist: None  Clinic Note: Chief Complaint  Patient presents with   Follow-up    Doing well.  Still having intermittent episodes of A-fib   ===================================  ASSESSMENT/PLAN   Problem List Items Addressed This Visit       Cardiology Problems   Hyperlipidemia, mixed (Chronic)    We switched over to rosuvastatin last year because of LDL 125.  Kristina Delgado should be due for having labs checked soon.  Reassess at that point.      Relevant Medications   flecainide (TAMBOCOR) 100 MG tablet   metoprolol tartrate (LOPRESSOR) 25 MG tablet   hydrochlorothiazide (HYDRODIURIL) 25 MG tablet   rosuvastatin (CRESTOR) 10 MG tablet   rivaroxaban (XARELTO) 20 MG TABS tablet   Essential hypertension (Chronic)    Blood pressure well controlled.  Kristina Delgado is on HCTZ as well as low-dose Lopressor (which is more for A-fib rate control than BP.)      Relevant Medications   flecainide (TAMBOCOR) 100 MG tablet   metoprolol tartrate (LOPRESSOR) 25 MG tablet   hydrochlorothiazide (HYDRODIURIL) 25 MG tablet   rosuvastatin (CRESTOR) 10 MG tablet   rivaroxaban (XARELTO) 20 MG TABS tablet   Paroxysmal atrial fibrillation (HCC) - Primary (Chronic)    Still having enough episodes that are somewhat bothersome to her.  It seems like for the most part flecainide is working, but Kristina Delgado may just need additional dosing.  For now Kristina Delgado will continue current dose of Lopressor 25 mg twice daily with additional one half to full dose as needed for breakthrough episodes of A-fib. Continue Xarelto Continue PRN flecainide. => Flecainide 100 mg tablets take 2 tablets at onset of A-fib and then additional 1 tablet 12 hours after.  May take up to 4 doses of the 100 mg every 12 hours, then would continue 50 mg twice daily for 4 doses following conversion      Relevant Medications   flecainide (TAMBOCOR)  100 MG tablet   metoprolol tartrate (LOPRESSOR) 25 MG tablet   hydrochlorothiazide (HYDRODIURIL) 25 MG tablet   rosuvastatin (CRESTOR) 10 MG tablet   rivaroxaban (XARELTO) 20 MG TABS tablet   Other Relevant Orders   EKG 12-Lead     Other   Current use of long term anticoagulation (Chronic)    This patients CHA2DS2-VASc Score and unadjusted Ischemic Stroke Rate (% per year) is equal to 3.2 % stroke rate/year from a score of 3  Above score calculated as 1 point each if present [CHF, CAD, DM, Vascular=MI/PAD/Aortic Plaque, Age if 65-74, or Female] Above score calculated as 2 points each if present [Age > 75, or Stroke/TIA/TE]  Tolerating Xarelto with no issues.  Okay to hold Xarelto 24 to 72 hours preop for procedures or surgeries. 24 hours for minor procedures such as skin procedures 48 hours for intermediate risk procedure as 72 hours for high risk procedures       ===================================    HPI:    Kristina Delgado is a 66 y.o. female with a PMH notable for PAF and frequent PACs (on combination Lopressor and Pradaxa was recently initiated.  PRN flecainide), HTN, HLD below who presents today for delayed annual follow-up at the request of Marda Stalker, PA-C.  Kristina Delgado was last seen on December 05, 2020.  Kristina Delgado was temporarily in town preparing for their final few months in [place] before moving back to to the states for good after almost 17  years in [place].  They were back for 2 different weddings.  Kristina Delgado had maybe had about 7 or 8 spells where Kristina Delgado felt A-fib where Kristina Delgado was taken extra dose of beta-blocker otherwise feeling fine.  Most of the episodes of A-fib do occur in the morning oftentimes waking her up from sleep.  Kristina Delgado did indicate there is a lot more stress with her and her husband Zeke returning back to the [place]. Added as needed flecainide 200 mg x 1 for A-fib burst with the 100 mg every 12 hours until out of A-fib.  (4 doses) Continued metoprolol to 1 mg  twice daily along with Xarelto. Switch from pravastatin to rosuvastatin.  Recent Hospitalizations: None  Reviewed  CV studies:    The following studies were reviewed today: (if available, images/films reviewed: From Epic Chart or Care Everywhere) None:  Interval History:   Kristina Delgado returns today for somewhat delayed follow-up stating that Kristina Delgado is doing relatively well.  Kristina Delgado said Kristina Delgado has had about 3-4 episodes where Kristina Delgado had to use flecainide.  Only 1 time did Kristina Delgado get through the whole 4 doses of flecainide for broke.  All the rest of the lasted only few hours and broke pretty soon after flecainide.  Kristina Delgado states Kristina Delgado usually also takes next dose of metoprolol when Kristina Delgado takes the flecainide.  For most of the time this does not and the episode does not last more than a couple hours.  Most the time also, the heart rates have been pretty well controlled but has been at least 1 or 2 times when he is gone over 160 bpm.  Kristina Delgado does note that the most frequent episodes were when Kristina Delgado was going through the whole moved process earlier this year.  Since then Kristina Delgado has not had that many episodes which would suggest that is her social situation settles down things seem to be better.  Kristina Delgado just feels a discomfort level when Kristina Delgado is in A-fib, but did not necessarily have any chest pain or pressure.  Kristina Delgado says quite often they wake her up from sleep and then Kristina Delgado can go back to sleep.  Is not happening as frequently though.  CV Review of Symptoms (Summary): no chest pain or dyspnea on exertion positive for - irregular heartbeat, palpitations, rapid heart rate, and consistent with A-fib negative for - edema, orthopnea, paroxysmal nocturnal dyspnea, shortness of breath, or syncope or near CP, TIA/amaurosis fugax, claudication  REVIEWED OF SYSTEMS   Review of Systems  Constitutional:  Negative for malaise/fatigue and weight loss.  Respiratory: Negative.    Musculoskeletal:  Positive for back pain and joint pain  (Mild aches and pains).  Psychiatric/Behavioral:  The patient is not nervous/anxious.        Lots of stress, although it is easing up now the father Kristina Delgado is after the initial move process.  Kristina Delgado is having hard time reading simulating into the culture.   I have reviewed and (if needed) personally updated the patient's problem list, medications, allergies, past medical and surgical history, social and family history.   PAST MEDICAL HISTORY   Past Medical History:  Diagnosis Date   Abnormal Pap smear of cervix    yrs ago   Atrial fibrillation (HCC)    GERD (gastroesophageal reflux disease)    Followed by Dr. Oletta Lamas. Plan to repeat EGD: In 2021   Heart palpitations    Hyperlipidemia    Hypertension    previous hx   IBS (irritable bowel syndrome)  Malignant melanoma ([place]) 2006   History   Migraine    PAC (premature atrial contraction)    Rectal leakage    Simple endometrial hyperplasia without atypia 08/18/2013   Skin cancer 09/30/2004   malignant melanoma.    PAST SURGICAL HISTORY   Past Surgical History:  Procedure Laterality Date   CARDIOPULMONARY EXERCISE TEST  09/02/2017   No suggestion of any pulmonary or circulatory limitation to exercise with normal functional capacity but there is concern for EKG changes there were still nondiagnostic.  Recommendation was to check a coronary CT angiogram   CERVICAL CERCLAGE     CHOLECYSTECTOMY  1980   CORONARY CT ANGIOGRAM  09/2017    Aortic atherosclerosis noted.  Calcium score 90.  Mild nonobstructive disease in the LAD less than 50%.  Minimal disease in the RCA.  Recommend aggressive risk factor modification.   ENDOMETRIAL BIOPSY  2010   negative atypia/hyperplasia   Holter Monitor  06/2016   24 - ([facility], [place]) close to 119,000 beats. Minimum heart rate 66, average 84, maximum 143 of sinus rhythm. No PVCs noted no VT noted. Just over 16,000 supraventricular contractions/PACs noted. 3 episodes of greater than 10  beats paroxysmal atrial tachycardia (PAT - with max rate of 170 bpm). --> Higher incidence of PACs noted in 19 hours.   MELANOMA EXCISION  2006   OTHER SURGICAL HISTORY     full buttock revision   SHOULDER ARTHROSCOPY     left 2011/right 2012   TRANSTHORACIC ECHOCARDIOGRAM  06/23/2016   [facility] ([place]): Normal LV size and function. Hyperdynamic LV function (has EF of 82% recorded) mild aortic regurgitation and mild mitral regurgitation. Mild/borderline grade 1 diastolic dysfunction. Otherwise normal wall thickness.   WRIST SURGERY     rt wrist    Immunization History  Administered Date(s) Administered   Influenza-Unspecified 11/22/2018   Moderna Sars-Covid-2 Vaccination 05/26/2019, 06/23/2019, 01/19/2020, 10/08/2020   Tdap 10/21/2011   Zoster Recombinat (Shingrix) 02/23/2018    MEDICATIONS/ALLERGIES   Current Meds  Medication Sig   ALPRAZolam (XANAX) 0.25 MG tablet Take 0.25 mg by mouth at bedtime as needed for anxiety.   BEPREVE 1.5 % SOLN 2 (two) times daily as needed.    dexlansoprazole (DEXILANT) 60 MG capsule Take 60 mg by mouth as needed.    DYMISTA 137-50 MCG/ACT SUSP 2 (two) times daily as needed.    glycopyrrolate (ROBINUL) 2 MG tablet Take 1 mg by mouth daily.    hyoscyamine (LEVSIN, ANASPAZ) 0.125 MG tablet Take 0.25 mg by mouth as needed. Takes 0.250 MG PRN   montelukast (SINGULAIR) 10 MG tablet daily.    [DISCONTINUED] flecainide (TAMBOCOR) 100 MG tablet Take 100 mg  2 tablets if needed for Afib then take 100 mg 1 tablet 12 hours later and may take 100 mg 1 tablet 12 hr later if still in Afib   [DISCONTINUED] hydrochlorothiazide (HYDRODIURIL) 25 MG tablet Take 1 tablet (25 mg total) by mouth daily. Take 25 mg one tablet by mouth daily   [DISCONTINUED] metoprolol tartrate (LOPRESSOR) 25 MG tablet Take 0.5 tablets (12.5 mg total) by mouth 2 (two) times daily.   [DISCONTINUED] rivaroxaban (XARELTO) 20 MG TABS tablet Take 1 tablet (20 mg total) by mouth daily  with supper.   [DISCONTINUED] rosuvastatin (CRESTOR) 10 MG tablet Take 1 tablet (10 mg total) by mouth daily.    Allergies  Allergen Reactions   Latex     Excoriated skin  -   Tegaderm.   Sulfa  Antibiotics Hives   Other     tegaderm dermatitis   Shrimp [Shellfish Allergy] Other (See Comments)    SOCIAL HISTORY/FAMILY HISTORY   Reviewed in Epic:  Pertinent findings:  Social History   Tobacco Use   Smoking status: Never   Smokeless tobacco: Never  Substance Use Topics   Alcohol use: Yes    Alcohol/week: 7.0 - 10.0 standard drinks of alcohol    Types: 7 - 10 Glasses of wine per week   Drug use: No   Social History   Social History Narrative   Kristina Delgado is a married mother of 2 children, no grandchildren. 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 Kristina Delgado lives with her husband.  They have recently moved back from having worked in [employer] living there for 17 years..    Kristina Delgado has a BS/BSN.   Kristina Delgado enjoys walking at least 50 minutes to an hour to 3 days a week. When Kristina Delgado doesn't do it is because either too hot, too cold or Kristina Delgado just feels too lazy.    OBJCTIVE -PE, EKG, labs   Wt Readings from Last 3 Encounters:  02/03/22 153 lb 6.4 oz (69.6 kg)  12/10/20 145 lb (65.8 kg)  12/05/20 146 lb (66.2 kg)    Physical Exam: BP 122/74 (BP Location: Left Arm, Patient Position: Sitting, Cuff Size: Normal)   Pulse 64   Ht '5\' 4"'$  (1.626 m)   Wt 153 lb 6.4 oz (69.6 kg)   LMP 08/08/2013   SpO2 97%   BMI 26.33 kg/m  Physical Exam Vitals reviewed.  Constitutional:      General: Kristina Delgado is not in acute distress.    Appearance: Normal appearance. Kristina Delgado is normal weight. Kristina Delgado is not ill-appearing (Well-nourished, well-groomed.) or toxic-appearing.  HENT:     Head: Normocephalic and atraumatic.  Neck:     Vascular: No carotid bruit or JVD.  Cardiovascular:     Rate and Rhythm: Normal rate and regular rhythm. No extrasystoles are present.    Pulses: Intact distal pulses.     Heart sounds: S1  normal and S2 normal. No murmur heard.    No friction rub. No gallop.  Pulmonary:     Effort: Pulmonary effort is normal. No respiratory distress.     Breath sounds: Normal breath sounds. No wheezing, rhonchi or rales.  Chest:     Chest wall: No tenderness.  Musculoskeletal:        General: Swelling present. Normal range of motion.     Cervical back: Normal range of motion and neck supple.  Skin:    General: Skin is warm and dry.  Neurological:     General: No focal deficit present.     Mental Status: Kristina Delgado is alert and oriented to person, place, and time.     Gait: Gait normal.  Psychiatric:        Mood and Affect: Mood normal.        Behavior: Behavior normal.        Thought Content: Thought content normal.        Judgment: Judgment normal.      Adult ECG Report  Rate: 64;  Rhythm: normal sinus rhythm; normal axis, intervals and durations.  Narrative Interpretation: Stable  Recent Labs: Due to have her PCP recheck labs soon. Lab Results  Component Value Date   CHOL 211 (H) 12/04/2020   HDL 73 12/04/2020   LDLCALC 125 (H) 12/04/2020   TRIG 72 12/04/2020   CHOLHDL 2.9 12/04/2020  Lab Results  Component Value Date   CREATININE 0.67 12/04/2020   BUN 12 12/04/2020   NA 133 (L) 12/04/2020   K 4.3 12/04/2020   CL 93 (L) 12/04/2020   CO2 24 12/04/2020      Latest Ref Rng & Units 11/18/2015    1:40 PM 10/23/2014    1:15 PM 09/05/2013    3:31 PM  CBC  WBC 3.9 - 10.3 10e3/uL 5.9  5.2    Hemoglobin 11.6 - 15.9 g/dL 12.7  13.1  13.1   Hematocrit 34.8 - 46.6 % 37.3  39.4    Platelets 145 - 400 10e3/uL 245  267      No results found for: "HGBA1C" Lab Results  Component Value Date   TSH 1.697 09/26/2012    ================================================== I spent a total of 35 minutes with the patient spent in direct patient consultation.  Additional time spent with chart review  / charting (studies, outside notes, etc): 16 min Total Time: 51 min  Current medicines  are reviewed at length with the patient today.  (+/- concerns) none  Notice: This dictation was prepared with Dragon dictation along with smart phrase technology. Any transcriptional errors that result from this process are unintentional and may not be corrected upon review.  Studies Ordered:   Orders Placed This Encounter  Procedures   EKG 12-Lead   Meds ordered this encounter  Medications   flecainide (TAMBOCOR) 100 MG tablet    Sig: Take 100 mg  2 tablets if needed for Afib then take 100 mg 1 tablet 12 hours later and may take 100 mg 1 tablet 12 hr later if still in Afib Up to 6 doses.  Once afib breaks take 50 mg  two doses 12 hour apart then stop    Dispense:  180 tablet    Refill:  3   metoprolol tartrate (LOPRESSOR) 25 MG tablet    Sig: Take 0.5 tablets (12.5 mg total) by mouth 2 (two) times daily.    Dispense:  180 tablet    Refill:  3   hydrochlorothiazide (HYDRODIURIL) 25 MG tablet    Sig: Take 1 tablet (25 mg total) by mouth daily. Take 25 mg one tablet by mouth daily    Dispense:  90 tablet    Refill:  3   rosuvastatin (CRESTOR) 10 MG tablet    Sig: Take 1 tablet (10 mg total) by mouth daily.    Dispense:  90 tablet    Refill:  3   rivaroxaban (XARELTO) 20 MG TABS tablet    Sig: Take 1 tablet (20 mg total) by mouth daily with supper.    Dispense:  90 tablet    Refill:  3    Patient Instructions / Medication Changes & Studies & Tests Ordered   Patient Instructions  Medication Instructions:   No changes except if you develop Afib you can take  up to 6 doses of Flecainide   12 hours apart, once  you break Afib  take 50 mg of flecainide 50 mg 2 dose 12 hours apart    *If you need a refill on your cardiac medications before your next appointment, please call your pharmacy*   Lab Work: Not needed .   Testing/Procedures:  Not needed  Follow-Up: At [facility], you and your health needs are our priority.  As part of our continuing mission to provide you  with exceptional heart care, we have created designated Provider Care Teams.  These Care Teams include your  primary Cardiologist (physician) and Advanced Practice Providers (APPs -  Physician Assistants and Nurse Practitioners) who all work together to provide you with the care you need, when you need it.     Your next appointment:   3 month(s)  The format for your next appointment:   In Person  Provider:   Glenetta Hew, MD    Other Instructions      Leonie Man, MD, MS Glenetta Hew, M.D., M.S. Interventional Cardiologist  [place]  Pager # [phone] Phone # [phone] [address] [place], [place] [address]   Thank you for choosing [facility]!!

## 2022-02-07 ENCOUNTER — Encounter: Payer: Self-pay | Admitting: Cardiology

## 2022-02-07 NOTE — Assessment & Plan Note (Signed)
Still having enough episodes that are somewhat bothersome to her.  It seems like for the most part flecainide is working, but she may just need additional dosing.  For now she will continue current dose of Lopressor 25 mg twice daily with additional one half to full dose as needed for breakthrough episodes of A-fib. Continue Xarelto Continue PRN flecainide. => Flecainide 100 mg tablets take 2 tablets at onset of A-fib and then additional 1 tablet 12 hours after.  May take up to 4 doses of the 100 mg every 12 hours, then would continue 50 mg twice daily for 4 doses following conversion

## 2022-02-07 NOTE — Assessment & Plan Note (Addendum)
Blood pressure well controlled.  She is on HCTZ as well as low-dose Lopressor (which is more for A-fib rate control than BP.)

## 2022-02-07 NOTE — Assessment & Plan Note (Signed)
This patients CHA2DS2-VASc Score and unadjusted Ischemic Stroke Rate (% per year) is equal to 3.2 % stroke rate/year from a score of 3  Above score calculated as 1 point each if present [CHF, CAD, DM, Vascular=MI/PAD/Aortic Plaque, Age if 65-74, or Female] Above score calculated as 2 points each if present [Age > 75, or Stroke/TIA/TE]  Tolerating Xarelto with no issues.  Okay to hold Xarelto 24 to 72 hours preop for procedures or surgeries. 24 hours for minor procedures such as skin procedures 48 hours for intermediate risk procedure as 72 hours for high risk procedures

## 2022-02-07 NOTE — Assessment & Plan Note (Signed)
We switched over to rosuvastatin last year because of LDL 125.  She should be due for having labs checked soon.  Reassess at that point.

## 2022-02-19 ENCOUNTER — Other Ambulatory Visit: Payer: Self-pay | Admitting: Family Medicine

## 2022-02-19 DIAGNOSIS — Z1231 Encounter for screening mammogram for malignant neoplasm of breast: Secondary | ICD-10-CM

## 2022-03-26 ENCOUNTER — Encounter: Payer: Self-pay | Admitting: Cardiology

## 2022-03-27 MED ORDER — METOPROLOL TARTRATE 25 MG PO TABS: 25.0000 mg | ORAL_TABLET | Freq: Two times a day (BID) | ORAL | 3 refills | Status: DC

## 2022-04-23 ENCOUNTER — Ambulatory Visit
Admission: RE | Admit: 2022-04-23 | Discharge: 2022-04-23 | Disposition: A | Discharge status: Home / Self Care | Payer: Managed Care, Other (non HMO) | Source: Ambulatory Visit | Attending: Family Medicine | Admitting: Family Medicine

## 2022-04-23 DIAGNOSIS — Z1231 Encounter for screening mammogram for malignant neoplasm of breast: Secondary | ICD-10-CM

## 2022-04-27 ENCOUNTER — Other Ambulatory Visit: Payer: Self-pay | Admitting: Family Medicine

## 2022-04-27 DIAGNOSIS — R928 Other abnormal and inconclusive findings on diagnostic imaging of breast: Secondary | ICD-10-CM

## 2022-05-01 ENCOUNTER — Other Ambulatory Visit: Payer: Self-pay | Admitting: Family Medicine

## 2022-05-01 ENCOUNTER — Ambulatory Visit
Admission: RE | Admit: 2022-05-01 | Discharge: 2022-05-01 | Disposition: A | Discharge status: Home / Self Care | Payer: Managed Care, Other (non HMO) | Source: Ambulatory Visit | Attending: Family Medicine | Admitting: Family Medicine

## 2022-05-01 ENCOUNTER — Ambulatory Visit: Payer: Managed Care, Other (non HMO)

## 2022-05-01 DIAGNOSIS — R928 Other abnormal and inconclusive findings on diagnostic imaging of breast: Secondary | ICD-10-CM

## 2022-05-05 ENCOUNTER — Encounter: Payer: Self-pay | Admitting: Cardiology

## 2022-05-05 ENCOUNTER — Ambulatory Visit: Payer: Managed Care, Other (non HMO) | Attending: Cardiology | Admitting: Cardiology

## 2022-05-05 VITALS — BP 122/82 | HR 62 | Ht 64.5 in | Wt 159.0 lb

## 2022-05-05 DIAGNOSIS — I10 Essential (primary) hypertension: Secondary | ICD-10-CM

## 2022-05-05 DIAGNOSIS — I491 Atrial premature depolarization: Secondary | ICD-10-CM | POA: Diagnosis not present

## 2022-05-05 DIAGNOSIS — E782 Mixed hyperlipidemia: Secondary | ICD-10-CM

## 2022-05-05 DIAGNOSIS — D6869 Other thrombophilia: Secondary | ICD-10-CM

## 2022-05-05 DIAGNOSIS — I480 Paroxysmal atrial fibrillation: Secondary | ICD-10-CM | POA: Diagnosis not present

## 2022-05-05 NOTE — Progress Notes (Signed)
Primary Care Provider: Marda Stalker, [place] Cardiologist: Glenetta Hew, MD Electrophysiologist: None  Referring Provider:  Marda Stalker, PA-C -> [name] returns here today at the request of Marda Stalker, PA-C  Clinic Note: Chief Complaint  Patient presents with   Follow-up    3 months.  Doing well.   Atrial Fibrillation    PAF-3 episodes requiring flecainide (2 of them use metoprolol)   ===================================  ASSESSMENT/PLAN   Problem List Items Addressed This Visit       Cardiology Problems   Paroxysmal atrial fibrillation (HCC) - Primary (Chronic)    Short little spells of A-fib that usually easily broken with flecainide.  She usually is able to break, the dose of Lopressor +100 mg flecainide.  In the last 3 months she has not had to use more than 1 flecainide tablet.  Again stressed the importance of avoiding triggers, staying adequately rested. Remains on Xarelto with no bleeding issues.  Continue current dose of Lopressor 25 mg twice daily with additional one half to full dose as needed for breakthrough episodes of A-fib. Continue Xarelto Continue PRN flecainide. => Flecainide 100 mg tablets take 2 tablets at onset of A-fib and then additional 1 tablet 12 hours after.  May take up to 4 doses of the 100 mg every 12 hours, then would continue 50 mg twice daily for 4 doses following conversion      Relevant Orders   EKG 12-Lead (Completed)   Hyperlipidemia, mixed (Chronic)    With family history of CAD, would like to try to see an LDL closer to the 100 range.  Unfortunately the most recent lipids did not actually report LDL.  For now I think continuing current dose of rosuvastatin 10 mg daily is reasonable, but low threshold to titrate up to 20 mg versus adding Zetia/Nexletol.      Hypercoagulable state due to paroxysmal atrial fibrillation (HCC); CHA2DS2-VASc score 3 (CAD, age 72, female) (Chronic)    Seems to be  tolerating Xarelto with no major issues. Okay to hold Xarelto 24 to 72 hours preop for procedures or surgeries. 24 hours for minor procedures such as skin procedures 48 hours for intermediate risk procedure as 72 hours for high risk procedures      Finding of multiple premature atrial contractions by electrocardiography (Chronic)    Monitor showed frequent PACs and occasional PAT along with 3 A-fib.  Pretty much she has been controlled with her standard metoprolol dose.  I did indicate that if she is having more episodes that she could take an additional dose of metoprolol.  Thankfully, these are not all that bothersome to her. Continue Lopressor 25 mg twice daily      Essential hypertension (Chronic)    Stable.  Is only on HCTZ 25 mg daily along with Lopressor 25 mg twice daily.  Much more room to titrate up beta-blocker, if additional blood pressure control would be needed, would probably need to add ARB/ACE I (well-documented A-fib benefit)      ===================================  HPI:    Kristina Delgado is a 67 y.o. female with a PMH notable for PAF, frequent PACs, HTN, and HLD who presents today for delayed annual follow-up.  Kristina Delgado was last seen on February 03, 2022.  She was doing relatively well.  Has had about 3-4 episodes in the interim (since her visit in September 2022) where she had used flecainide for breakthrough spells.  1 occasion required for full doses of flecainide.  All the rest were only a few hours and broke after the first flecainide dose.  She still takes the additional dose of metoprolol with the first dose flecainide.  Usually this is successful in cardioverting her.  She does note that her heart rate has been up as high as 160s which is why she started going back to taking the beta-blocker with it.  Her major symptoms she notes with A-fib his discomfort in her chest along with fatigue.  She says that quite often these occur waking her up from sleep  and then she is hard to go back to sleep.  Recent Hospitalizations: None  Reviewed  CV studies:    The following studies were reviewed today: (if available, images/films reviewed: From Epic Chart or Care Everywhere) None:  Interval History:   Kristina Delgado returns here today for 48-monthfollow-up overall doing pretty well.  She still had about 2 or 3 spells of A-fib noted on her [employer].  On 2 occasions they broke after taking an extra dose of metoprolol along with flecainide.  1 occasion she was not going very fast so she only took the flecainide.  Each time though it was just the 1 dose of flecainide. In addition to having A-fib on her monitor, she is also had short little spells of fluttering sensations that last less than last time.  Seconds.  There.  These are usually random and sporadic when they occur.  Not associated with any chest pain or pressure.  She is otherwise doing pretty well.  Starting to get assimilated back into American culture.  Having some issues with the fact that she is not able to eat as healthy.  CV Review of Symptoms (Summary): no chest pain or dyspnea on exertion positive for - irregular heartbeat, palpitations, rapid heart rate, and as noted above negative for - edema, loss of consciousness, orthopnea, paroxysmal nocturnal dyspnea, shortness of breath, or lightheadedness, dizziness or wooziness, syncope/near syncope TIA/amaurosis fugax, claudication  REVIEWED OF SYSTEMS   Review of Systems  Constitutional:  Negative for malaise/fatigue and weight loss.  HENT:  Negative for congestion.   Respiratory:  Negative for cough and shortness of breath.   Gastrointestinal:  Negative for blood in stool, constipation, diarrhea and melena.  Genitourinary:  Negative for dysuria and hematuria.  Musculoskeletal:  Negative for joint pain.  Neurological:  Negative for dizziness and focal weakness.  Psychiatric/Behavioral: Negative.  Negative for depression and  memory loss. The patient is not nervous/anxious and does not have insomnia.        Noted less stress.    I have reviewed and (if needed) personally updated the patient's problem list, medications, allergies, past medical and surgical history, social and family history.   PAST MEDICAL HISTORY   Past Medical History:  Diagnosis Date   Abnormal Pap smear of cervix    yrs ago   Atrial fibrillation (HCC)    GERD (gastroesophageal reflux disease)    Followed by Dr. EOletta Lamas Plan to repeat EGD: In 2021   Heart palpitations    Hyperlipidemia    Hypertension    previous hx   IBS (irritable bowel syndrome)    Malignant melanoma ([facility] 2006   History   Migraine    PAC (premature atrial contraction)    Rectal leakage    Simple endometrial hyperplasia without atypia 08/18/2013   Skin cancer 09/30/2004   malignant melanoma.    PAST SURGICAL HISTORY   Past Surgical History:  Procedure Laterality Date  CARDIOPULMONARY EXERCISE TEST  09/02/2017   No suggestion of any pulmonary or circulatory limitation to exercise with normal functional capacity but there is concern for EKG changes there were still nondiagnostic.  Recommendation was to check a coronary CT angiogram   CERVICAL CERCLAGE     CHOLECYSTECTOMY  1980   CORONARY CT ANGIOGRAM  09/2017    Aortic atherosclerosis noted.  Calcium score 90.  Mild nonobstructive disease in the LAD less than 50%.  Minimal disease in the RCA.  Recommend aggressive risk factor modification.   ENDOMETRIAL BIOPSY  2010   negative atypia/hyperplasia   Holter Monitor  06/2016   24 - ([facility], [place]) close to 119,000 beats. Minimum heart rate 66, average 84, maximum 143 of sinus rhythm. No PVCs noted no VT noted. Just over 16,000 supraventricular contractions/PACs noted. 3 episodes of greater than 10 beats paroxysmal atrial tachycardia (PAT - with max rate of 170 bpm). --> Higher incidence of PACs noted in 19 hours.   MELANOMA EXCISION  2006    OTHER SURGICAL HISTORY     full buttock revision   SHOULDER ARTHROSCOPY     left 2011/right 2012   TRANSTHORACIC ECHOCARDIOGRAM  06/23/2016   [facility] ([place]): Normal LV size and function. Hyperdynamic LV function (has EF of 82% recorded) mild aortic regurgitation and mild mitral regurgitation. Mild/borderline grade 1 diastolic dysfunction. Otherwise normal wall thickness.   WRIST SURGERY     rt wrist   MEDICATIONS/ALLERGIES   Current Meds  Medication Sig   ALPRAZolam (XANAX) 0.25 MG tablet Take 0.25 mg by mouth at bedtime as needed for anxiety.   BEPREVE 1.5 % SOLN 2 (two) times daily as needed.    dexlansoprazole (DEXILANT) 60 MG capsule Take 60 mg by mouth as needed.    DYMISTA 137-50 MCG/ACT SUSP 2 (two) times daily as needed.    flecainide (TAMBOCOR) 100 MG tablet Take 100 mg  2 tablets if needed for Afib then take 100 mg 1 tablet 12 hours later and may take 100 mg 1 tablet 12 hr later if still in Afib Up to 6 doses.  Once afib breaks take 50 mg  two doses 12 hour apart then stop   glycopyrrolate (ROBINUL) 2 MG tablet Take 1 mg by mouth daily.    hydrochlorothiazide (HYDRODIURIL) 25 MG tablet Take 1 tablet (25 mg total) by mouth daily. Take 25 mg one tablet by mouth daily   hyoscyamine (LEVSIN, ANASPAZ) 0.125 MG tablet Take 0.25 mg by mouth as needed. Takes 0.250 MG PRN   metoprolol tartrate (LOPRESSOR) 25 MG tablet Take 1 tablet (25 mg total) by mouth 2 (two) times daily.   montelukast (SINGULAIR) 10 MG tablet daily.    rivaroxaban (XARELTO) 20 MG TABS tablet Take 1 tablet (20 mg total) by mouth daily with supper.   rosuvastatin (CRESTOR) 10 MG tablet Take 1 tablet (10 mg total) by mouth daily.    Allergies  Allergen Reactions   Latex     Excoriated skin  -   Tegaderm.   Sulfa Antibiotics Hives   Other     tegaderm dermatitis   Shrimp [Shellfish Allergy] Other (See Comments)    SOCIAL HISTORY/FAMILY HISTORY   Reviewed in Epic:  Pertinent findings:  Social  History   Tobacco Use   Smoking status: Never   Smokeless tobacco: Never  Substance Use Topics   Alcohol use: Yes    Alcohol/week: 7.0 - 10.0 standard drinks of alcohol    Types: 7 -  10 Glasses of wine per week   Drug use: No   Social History   Social History Narrative   She is a married mother of 2 children, no grandchildren. 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 She lives with her husband.  They have recently moved back from having worked in [employer] living there for 17 years..    She has a BS/BSN.   She enjoys walking at least 50 minutes to an hour to 3 days a week. When she doesn't do it is because either too hot, too cold or she just feels too lazy.    OBJCTIVE -PE, EKG, labs   Wt Readings from Last 3 Encounters:  05/05/22 159 lb (72.1 kg)  02/03/22 153 lb 6.4 oz (69.6 kg)  12/10/20 145 lb (65.8 kg)    Physical Exam: BP 122/82   Pulse 62   Ht 5' 4.5" (1.638 m)   Wt 159 lb (72.1 kg)   LMP 08/08/2013   SpO2 99%   BMI 26.87 kg/m  Physical Exam Vitals reviewed.  Constitutional:      General: She is not in acute distress.    Appearance: Normal appearance. She is normal weight. She is not ill-appearing or toxic-appearing.  HENT:     Head: Normocephalic and atraumatic.  Eyes:     General: No scleral icterus. Neck:     Vascular: No carotid bruit or JVD.  Cardiovascular:     Rate and Rhythm: Normal rate and regular rhythm. No extrasystoles are present.    Pulses: Normal pulses.     Heart sounds: Normal heart sounds, S1 normal and S2 normal. No murmur heard.    No friction rub. No gallop.  Pulmonary:     Effort: Pulmonary effort is normal. No respiratory distress.     Breath sounds: Normal breath sounds. No wheezing, rhonchi or rales.  Musculoskeletal:        General: No swelling. Normal range of motion.     Cervical back: Normal range of motion and neck supple.  Skin:    General: Skin is warm and dry.  Neurological:     General: No focal deficit  present.     Mental Status: She is alert and oriented to person, place, and time.     Gait: Gait normal.  Psychiatric:        Mood and Affect: Mood normal.        Behavior: Behavior normal.        Thought Content: Thought content normal.        Judgment: Judgment normal.     Adult ECG Report  Rate: 62 ;  Rhythm: normal sinus rhythm and normal axis, intervals and durations. ;   Narrative Interpretation: Stable  Recent Labs:   02/17/2022: TC 205, TG 199, HDL 58, LDL 125; A1c 6.0. 09/17/2021: Hgb 12.7 Lab Results  Component Value Date   CHOL 211 (H) 12/04/2020   HDL 73 12/04/2020   LDLCALC 125 (H) 12/04/2020   TRIG 72 12/04/2020   CHOLHDL 2.9 12/04/2020   Lab Results  Component Value Date   CREATININE 0.67 12/04/2020   BUN 12 12/04/2020   NA 133 (L) 12/04/2020   K 4.3 12/04/2020   CL 93 (L) 12/04/2020   CO2 24 12/04/2020      Latest Ref Rng & Units 11/18/2015    1:40 PM 10/23/2014    1:15 PM 09/05/2013    3:31 PM  CBC  WBC 3.9 - 10.3 10e3/uL 5.9  5.2  Hemoglobin 11.6 - 15.9 g/dL 12.7  13.1  13.1   Hematocrit 34.8 - 46.6 % 37.3  39.4    Platelets 145 - 400 10e3/uL 245  267      No results found for: "HGBA1C" Lab Results  Component Value Date   TSH 1.697 09/26/2012    ================================================== I spent a total of 28 min with the patient spent in direct patient consultation.  Additional time spent with chart review  / charting (studies, outside notes, etc): 18 min Total Time: 46 min  Current medicines are reviewed at length with the patient today.  (+/- concerns) n.a  Notice: This dictation was prepared with Dragon dictation along with smart phrase technology. Any transcriptional errors that result from this process are unintentional and may not be corrected upon review.  Studies Ordered:   Orders Placed This Encounter  Procedures   EKG 12-Lead   No orders of the defined types were placed in this encounter.   Patient Instructions /  Medication Changes & Studies & Tests Ordered   Patient Instructions  Medication Instructions:  No changes  *If you need a refill on your cardiac medications before your next appointment, please call your pharmacy*   Lab Work: Not needed    Testing/Procedures:  Not needed  Follow-Up: At [facility], you and your health needs are our priority.  As part of our continuing mission to provide you with exceptional heart care, we have created designated Provider Care Teams.  These Care Teams include your primary Cardiologist (physician) and Advanced Practice Providers (APPs -  Physician Assistants and Nurse Practitioners) who all work together to provide you with the care you need, when you need it.     Your next appointment:   7 month(s)  The format for your next appointment:   In Person  Provider:   Glenetta Hew, MD       Leonie Man, MD, MS Glenetta Hew, M.D., M.S. Interventional Cardiologist  [place]  Pager # [phone] Phone # [phone] [address] [place], [place] [address]   Thank you for choosing [facility] [place]!!

## 2022-05-05 NOTE — Patient Instructions (Signed)
Medication Instructions:  No changes  *If you need a refill on your cardiac medications before your next appointment, please call your pharmacy*   Lab Work: Not needed    Testing/Procedures:  Not needed  Follow-Up: At [facility], you and your health needs are our priority.  As part of our continuing mission to provide you with exceptional heart care, we have created designated Provider Care Teams.  These Care Teams include your primary Cardiologist (physician) and Advanced Practice Providers (APPs -  Physician Assistants and Nurse Practitioners) who all work together to provide you with the care you need, when you need it.     Your next appointment:   7 month(s)  The format for your next appointment:   In Person  Provider:   Glenetta Hew, MD

## 2022-05-14 ENCOUNTER — Encounter: Payer: Self-pay | Admitting: Cardiology

## 2022-05-14 NOTE — Assessment & Plan Note (Signed)
Short little spells of A-fib that usually easily broken with flecainide.  She usually is able to break, the dose of Lopressor +100 mg flecainide.  In the last 3 months she has not had to use more than 1 flecainide tablet.  Again stressed the importance of avoiding triggers, staying adequately rested. Remains on Xarelto with no bleeding issues.  Continue current dose of Lopressor 25 mg twice daily with additional one half to full dose as needed for breakthrough episodes of A-fib. Continue Xarelto Continue PRN flecainide. => Flecainide 100 mg tablets take 2 tablets at onset of A-fib and then additional 1 tablet 12 hours after.  May take up to 4 doses of the 100 mg every 12 hours, then would continue 50 mg twice daily for 4 doses following conversion

## 2022-05-14 NOTE — Assessment & Plan Note (Signed)
With family history of CAD, would like to try to see an LDL closer to the 100 range.  Unfortunately the most recent lipids did not actually report LDL.  For now I think continuing current dose of rosuvastatin 10 mg daily is reasonable, but low threshold to titrate up to 20 mg versus adding Zetia/Nexletol.

## 2022-05-14 NOTE — Assessment & Plan Note (Signed)
Monitor showed frequent PACs and occasional PAT along with 3 A-fib.  Pretty much she has been controlled with her standard metoprolol dose.  I did indicate that if she is having more episodes that she could take an additional dose of metoprolol.  Thankfully, these are not all that bothersome to her. Continue Lopressor 25 mg twice daily

## 2022-05-14 NOTE — Assessment & Plan Note (Addendum)
Seems to be tolerating Xarelto with no major issues. Okay to hold Xarelto 24 to 72 hours preop for procedures or surgeries. 24 hours for minor procedures such as skin procedures 48 hours for intermediate risk procedure as 72 hours for high risk procedures

## 2022-05-14 NOTE — Assessment & Plan Note (Signed)
Stable.  Is only on HCTZ 25 mg daily along with Lopressor 25 mg twice daily.  Much more room to titrate up beta-blocker, if additional blood pressure control would be needed, would probably need to add ARB/ACE I (well-documented A-fib benefit)

## 2022-11-25 ENCOUNTER — Encounter: Payer: Self-pay | Admitting: Nurse Practitioner

## 2022-11-25 ENCOUNTER — Ambulatory Visit (INDEPENDENT_AMBULATORY_CARE_PROVIDER_SITE_OTHER): Payer: Managed Care, Other (non HMO) | Admitting: Nurse Practitioner

## 2022-11-25 VITALS — BP 126/72 | HR 61 | Ht 63.78 in | Wt 138.2 lb

## 2022-11-25 DIAGNOSIS — Z01419 Encounter for gynecological examination (general) (routine) without abnormal findings: Secondary | ICD-10-CM | POA: Diagnosis not present

## 2022-11-25 DIAGNOSIS — R159 Full incontinence of feces: Secondary | ICD-10-CM | POA: Diagnosis not present

## 2022-11-25 DIAGNOSIS — Z780 Asymptomatic menopausal state: Secondary | ICD-10-CM | POA: Diagnosis not present

## 2022-11-25 DIAGNOSIS — R339 Retention of urine, unspecified: Secondary | ICD-10-CM

## 2022-11-25 DIAGNOSIS — R143 Flatulence: Secondary | ICD-10-CM | POA: Diagnosis not present

## 2022-11-25 NOTE — Progress Notes (Unsigned)
Kristina Delgado March 21, 1956 [id number]   History:  67 y.o. [id number] presents for annual exam. Postmenopausal - no HRT, no bleeding. Abnormal pap many years ago without intervention. HTN, HLD, asthma managed by PCP, 2006 melanoma, a fib managed by cardiology, on Xarelto. Complains of not fully emptying bladder but if she sits for a minute she is able to. Denies urinary frequency, urgency or hematuria. Has been having increased flatulence and fecal incontinence. During incontinence episodes stool is liquid. Has appointment with GI this month. Has been doing [employer] for 6 months.   Gynecologic History Patient's last menstrual period was 08/08/2013.   Contraception/Family planning: post menopausal status Sexually active: Yes  Health Maintenance Last Pap: 10/11/2018. Results were: Normal neg HPV, 5-year repeat Last mammogram: 04/23/2022. Results were: Possible left breast asymmetry, f/u imaging showed stable benign mass since 2007, likely lymph node Last colonoscopy: 2021? Results were: Normal, 5-year recall Last Dexa: 12/18/2020. Results were: Normal  Past medical history, past surgical history, family history and social history were all reviewed and documented in the EPIC chart. Lived in [place] x 16 years, moved back to [place] spring 2023. 2 sons.   ROS:  A ROS was performed and pertinent positives and negatives are included.  Exam:  Vitals:   11/25/22 1603  BP: 126/72  Pulse: 61  SpO2: 99%  Weight: 138 lb 3.2 oz (62.7 kg)  Height: 5' 3.78" (1.62 m)    Body mass index is 23.89 kg/m.  General appearance:  Normal Thyroid:  Symmetrical, normal in size, without palpable masses or nodularity. Respiratory  Auscultation:  Clear without wheezing or rhonchi Cardiovascular  Auscultation:  Regular rate, without rubs, murmurs or gallops  Edema/varicosities:  Not grossly evident Abdominal  Soft,nontender, without masses, guarding or rebound.  Liver/spleen:  No organomegaly noted  Hernia:  None  appreciated  Skin  Inspection:  Grossly normal Breasts: Examined lying and sitting.   Right: Without masses, retractions, nipple discharge or axillary adenopathy.   Left: Without masses, retractions, nipple discharge or axillary adenopathy. Genitourinary   Inguinal/mons:  Normal without inguinal adenopathy  External genitalia:  Normal appearing vulva with no masses, tenderness, or lesions  BUS/Urethra/Skene's glands:  Normal  Vagina:  Atrophic changes  Cervix:  Normal appearing without discharge or lesions  Uterus:  Normal in size, shape and contour.  Midline and mobile, nontender  Adnexa/parametria:     Rt: Normal in size, without masses or tenderness.   Lt: Normal in size, without masses or tenderness.  Anus and perineum: Normal  Digital rectal exam: Not indicated  Patient informed chaperone available to be present for breast and pelvic exam. Patient has requested no chaperone to be present. Patient has been advised what will be completed during breast and pelvic exam.   Assessment/Plan:  67 y.o. Kristina Delgado for annual exam.   Well female exam with routine gynecological exam - Education provided on SBEs, importance of preventative screenings, current guidelines, high calcium diet, regular exercise, and multivitamin daily.  Labs with PCP.   Postmenopausal - No HRT, no bleeding.   Screening for cervical cancer - Abnormal pap many years ago, no intervention required. Will repeat at 5-year interval per guidelines. We did discuss current guidelines and the option to stop screening.   Screening for breast cancer - Normal mammogram history.  Continue annual screenings.  Normal breast exam today.  Screening for colon cancer - 2021 colonoscopy. Will repeat at GI's recommended interval.   Screening for osteoporosis - Normal bone density 11/2020.  Return  in 1 year for annual.     Olivia Mackie DNP, 4:33 PM 11/25/2022

## 2022-12-04 ENCOUNTER — Encounter: Payer: Self-pay | Admitting: Cardiology

## 2022-12-04 ENCOUNTER — Ambulatory Visit: Payer: Managed Care, Other (non HMO) | Attending: Cardiology | Admitting: Cardiology

## 2022-12-04 VITALS — BP 124/82 | HR 59 | Ht 63.5 in | Wt 136.3 lb

## 2022-12-04 DIAGNOSIS — I10 Essential (primary) hypertension: Secondary | ICD-10-CM | POA: Diagnosis not present

## 2022-12-04 DIAGNOSIS — I340 Nonrheumatic mitral (valve) insufficiency: Secondary | ICD-10-CM | POA: Insufficient documentation

## 2022-12-04 DIAGNOSIS — D6869 Other thrombophilia: Secondary | ICD-10-CM | POA: Diagnosis not present

## 2022-12-04 DIAGNOSIS — I480 Paroxysmal atrial fibrillation: Secondary | ICD-10-CM

## 2022-12-04 DIAGNOSIS — Z8249 Family history of ischemic heart disease and other diseases of the circulatory system: Secondary | ICD-10-CM

## 2022-12-04 DIAGNOSIS — E782 Mixed hyperlipidemia: Secondary | ICD-10-CM

## 2022-12-04 DIAGNOSIS — I351 Nonrheumatic aortic (valve) insufficiency: Secondary | ICD-10-CM | POA: Insufficient documentation

## 2022-12-04 MED ORDER — METOPROLOL SUCCINATE ER 25 MG PO TB24: 25.0000 mg | ORAL_TABLET | Freq: Every day | ORAL | 3 refills | Status: DC

## 2022-12-04 MED ORDER — METOPROLOL TARTRATE 25 MG PO TABS: 25.0000 mg | ORAL_TABLET | ORAL | Status: DC | PRN

## 2022-12-04 MED ORDER — HYDROCHLOROTHIAZIDE 12.5 MG PO CAPS: 12.5000 mg | ORAL_CAPSULE | Freq: Every day | ORAL | 3 refills | Status: DC

## 2022-12-04 NOTE — Assessment & Plan Note (Signed)
Thankfully, her brief episodes of A-fib are easily broken with flecainide and occasionally combination flecainide plus Lopressor.  For now I have reduced the beta-blocker dose by converting to Toprol 25 mg nightly to give a more broad coverage and avoid the bradycardia issues but still allow for the 25 mg Lopressor to be used as breakthrough.  Plan: Stop Lopressor 25 mg twice daily and convert to Toprol 25 mg nightly Keep Lopressor 25 mg to be used as needed for breakthrough spells of rapid A-fib Continue Xarelto Continue PRN flecainide. => Flecainide 100 mg tablets take 2 tablets at onset of A-fib and then additional 1 tablet 12 hours after.  May take up to 4 doses of the 100 mg every 12 hours, then would continue 50 mg twice daily for 4 doses following conversion

## 2022-12-04 NOTE — Assessment & Plan Note (Signed)
Reassuring Coronary CTA and CPX.  Continue target LDL least less than 100 if not better.  However with her having some cognitive issues we can have her stop rosuvastatin for now.  Most recent LDL was 83 so we have room to allow for statin holiday. Other risk factors including blood pressure also better.  Hopefully with the weight loss this will help both the lipids and blood pressure.

## 2022-12-04 NOTE — Assessment & Plan Note (Signed)
Targeting LDL<100.  Currently 83.  With cognitive issues, I agree with pausing rosuvastatin use until she can have further evaluation done.  We can reassess in follow-up and discussed possibility of using Nexlizet

## 2022-12-04 NOTE — Assessment & Plan Note (Signed)
Blood pressure seems to be very well-controlled and with her weight loss she probably is not requiring as much dosing.  Concerned of a bit about orthostatic dizziness.  Plan:  Reduce HCTZ to 1/2 tablet Switch beta-blocker to Toprol 25 mg nightly with 25 mg Lopressor PRN tachycardia/breakthrough A-fib

## 2022-12-04 NOTE — Assessment & Plan Note (Signed)
She had an echocardiogram back in 2018 suggesting aortic insufficiency.  The recommendation at that time was to reassess and we had planned to do so, but then she developed A-fib.  My intention was to have rechecked in 2021 but she was still in [place] at the time.  Now that she is back in the [place] we can get another reassessment to have an echocardiogram he on file.  Plan: Check 2D echo
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
## 2022-12-04 NOTE — Assessment & Plan Note (Signed)
CHA2DS2-VASc score 3.  Remains on Xarelto with no bleeding issues.  Okay to hold Xarelto 24 to 72 hours preop for procedures or surgeries. 24 hours for minor procedures such as skin procedures 48 hours for intermediate risk procedure as 72 hours for high risk procedures

## 2022-12-04 NOTE — Patient Instructions (Addendum)
Medication Instructions:    Start Metoprolol succinate 25 mg in the evening   Use Metoprolol tartrate  as directed needed with  flecainide     Do not take Rosuvastatin  until next visit   *If you need a refill on your cardiac medications before your next appointment, please call your pharmacy*   Lab Work:  Not needed   Testing/Procedures:  Not needed  Follow-Up: At [facility], you and your health needs are our priority.  As part of our continuing mission to provide you with exceptional heart care, we have created designated Provider Care Teams.  These Care Teams include your primary Cardiologist (physician) and Advanced Practice Providers (APPs -  Physician Assistants and Nurse Practitioners) who all work together to provide you with the care you need, when you need it.  We recommend signing up for the patient portal called "MyChart".  Sign up information is provided on this After Visit Summary.  MyChart is used to connect with patients for Virtual Visits (Telemedicine).  Patients are able to view lab/test results, encounter notes, upcoming appointments, etc.  Non-urgent messages can be sent to your provider as well.   To learn more about what you can do with MyChart, go to [email or web address].    Your next appointment:   4 to 5 month(s)  The format for your next appointment:   In Person  Provider:   Bryan Lemma, MD

## 2022-12-18 DIAGNOSIS — H5203 Hypermetropia, bilateral: Secondary | ICD-10-CM | POA: Insufficient documentation

## 2022-12-18 DIAGNOSIS — H04123 Dry eye syndrome of bilateral lacrimal glands: Secondary | ICD-10-CM | POA: Insufficient documentation

## 2022-12-18 DIAGNOSIS — H0100A Unspecified blepharitis right eye, upper and lower eyelids: Secondary | ICD-10-CM | POA: Insufficient documentation

## 2022-12-18 DIAGNOSIS — H02831 Dermatochalasis of right upper eyelid: Secondary | ICD-10-CM | POA: Insufficient documentation

## 2022-12-23 ENCOUNTER — Encounter: Payer: Self-pay | Admitting: Cardiology

## 2022-12-23 DIAGNOSIS — I480 Paroxysmal atrial fibrillation: Secondary | ICD-10-CM

## 2022-12-23 DIAGNOSIS — I351 Nonrheumatic aortic (valve) insufficiency: Secondary | ICD-10-CM

## 2022-12-25 NOTE — Telephone Encounter (Signed)
Thnx -- I must have forgotten to mention during sign over  [facility]

## 2022-12-30 ENCOUNTER — Telehealth (HOSPITAL_BASED_OUTPATIENT_CLINIC_OR_DEPARTMENT_OTHER): Payer: Self-pay | Admitting: *Deleted

## 2022-12-30 NOTE — Telephone Encounter (Signed)
   Patient Name: Kristina Delgado  DOB: Apr 09, 1955 MRN: [id number]  Primary Cardiologist: Bryan Lemma, MD  Chart reviewed as part of pre-operative protocol coverage. Given past medical history and time since last visit, based on ACC/AHA guidelines, Kristina Delgado is at acceptable risk for the planned procedure without further cardiovascular testing.   Per Dr. Herbie Baltimore, who saw pt recently, "OK to go for c-scope ; ok to hold Xarelto x 2 d pre-op as last note mentions.  DH"  Per office protocol, patient can hold Xarelo for 2 days prior to procedure. Please resume Xarelto as soon as possible postprocedure, at the discretion of the surgeon.   The patient was advised that if she develops new symptoms prior to surgery to contact our office to arrange for a follow-up visit, and she verbalized understanding.  I will route this recommendation to the requesting party via Epic fax function and remove from pre-op pool.  Please call with questions.  Joylene Grapes, NP 12/30/2022, 4:33 PM

## 2022-12-30 NOTE — Telephone Encounter (Signed)
Patient with diagnosis of afib on Xarelto for anticoagulation.    Procedure: endoscopy Date of procedure: 01/19/23   CHA2DS2-VASc Score = 3   This indicates a 3.2% annual risk of stroke. The patient's score is based upon: CHF History: 0 HTN History: 1 Diabetes History: 0 Stroke History: 0 Vascular Disease History: 0 Age Score: 1 Gender Score: 1      CrCl 56 ml/min  Per office protocol, patient can hold Xarelo for 2 days prior to procedure.    MD is requesting a 3 day hold. I do not think this is needed for this procedure. 2 days is sufficient to clear the medication.   **This guidance is not considered finalized until pre-operative APP has relayed final recommendations.**

## 2022-12-30 NOTE — Telephone Encounter (Signed)
   Pre-operative Risk Assessment    Patient Name: Kristina Delgado  DOB: 08/18/1955 MRN: [id number]      Request for Surgical Clearance    Procedure:   Endoscopy  Date of Surgery:  Clearance 01/19/23                                 Surgeon:  Dr. Vida Rigger Surgeon's Group or Practice Name:  [facility] Phone number:  [phone] Fax number:  [phone]   Type of Clearance Requested:   - Medical  - Pharmacy:  Hold Rivaroxaban (Xarelto) 3 days prior.   Type of Anesthesia:  Not Indicated   Additional requests/questions:  Pt's last ov was on December 04, 2022 with Dr. Herbie Baltimore.   Signed, Emmit Pomfret   12/30/2022, 9:31 AM

## 2022-12-30 NOTE — Telephone Encounter (Signed)
OK to go for c-scope ; ok to hold Xarelto x 2 d pre-op as last note mentions.  DH

## 2023-01-21 ENCOUNTER — Encounter: Payer: Self-pay | Admitting: Neurology

## 2023-01-21 ENCOUNTER — Telehealth: Payer: Self-pay | Admitting: Neurology

## 2023-01-21 ENCOUNTER — Ambulatory Visit (INDEPENDENT_AMBULATORY_CARE_PROVIDER_SITE_OTHER): Payer: Managed Care, Other (non HMO) | Admitting: Neurology

## 2023-01-21 VITALS — BP 134/83 | HR 65 | Ht 63.75 in | Wt 135.5 lb

## 2023-01-21 DIAGNOSIS — R4789 Other speech disturbances: Secondary | ICD-10-CM | POA: Diagnosis not present

## 2023-01-21 DIAGNOSIS — R413 Other amnesia: Secondary | ICD-10-CM | POA: Diagnosis not present

## 2023-01-21 NOTE — Patient Instructions (Addendum)
Dementia lab including TSH, B12, ATN profile and APO E4 genotype MRI brain without contrast I will contact you to go over the result Referral for formal neuropsychological testing Discussed ways the reduce the risk of dementia including keeping a good health, diet and exercise, at least 20 minutes per day, 5 days a week.  Continue to follow-up with PCP Return in 6 months or sooner if worse.  There are well-accepted and sensible ways to reduce risk for Alzheimers disease and other degenerative brain disorders .  Exercise Daily Walk A daily 20 minute walk should be part of your routine. Disease related apathy can be a significant roadblock to exercise and the only way to overcome this is to make it a daily routine and perhaps have a reward at the end (something your loved one loves to eat or drink perhaps) or a personal trainer coming to the home can also be very useful. Most importantly, the patient is much more likely to exercise if the caregiver / spouse does it with him/her. In general a structured, repetitive schedule is best.  General Health: Any diseases which effect your body will effect your brain such as a pneumonia, urinary infection, blood clot, heart attack or stroke. Keep contact with your primary care doctor for regular follow ups.  Sleep. A good nights sleep is healthy for the brain. Seven hours is recommended. If you have insomnia or poor sleep habits we can give you some instructions. If you have sleep apnea wear your mask.  Diet: Eating a heart healthy diet is also a good idea; fish and poultry instead of red meat, nuts (mostly non-peanuts), vegetables, fruits, olive oil or canola oil (instead of butter), minimal salt (use other spices to flavor foods), whole grain rice, bread, cereal and pasta and wine in moderation.Research is now showing that the MIND diet, which is a combination of The Mediterranean diet and the DASH diet, is beneficial for cognitive processing and longevity.  Information about this diet can be found in The MIND Diet, a book by Alonna Minium, MS, RDN, and online at WildWildScience.es  Finances, Power of [address] and Advance Directives: You should consider putting legal safeguards in place with regard to financial and medical decision making. While the spouse always has power of attorney for medical and financial issues in the absence of any form, you should consider what you want in case the spouse / caregiver is no longer around or capable of making decisions.

## 2023-01-21 NOTE — Progress Notes (Signed)
[facility]  PATIENT: Kristina Delgado DOB: 1955-08-23  REQUESTING CLINICIAN: Jarrett Soho, PA-C HISTORY FROM: Patient  REASON FOR VISIT: Memory loss    HISTORICAL  CHIEF COMPLAINT:  Chief Complaint  Patient presents with   New Patient (Initial Visit)    Rm12, alone, Memory loss: 26 MOCA    HISTORY OF PRESENT ILLNESS:  This is 67 year old woman past medical history of atrial fibrillation, hypertension, hyperlipidemia, depression who is presenting with complaint of memory loss for the past 2 years.  Patient reports in the last couple years, she has noted that she is more forgetful than before and mainly has word finding difficulty.  A year and a half ago when she moved back to the [place], she was living in [place] for 16 years, she noted that she was very depressed in the first 25-months, she did not go out of the house, and from then she noted also that her word finding difficulty got worse and her trouble remembering.  She also mentioned that she constantly check and recheck things, and will stiff forget items at the house. Her family tells her that she is repetitive, says the same thing over and over.  She also reports that she feels cold most of the time except at night when she wakes up in sweats.  Sleep has not been good in the past year, reported broken sleep but she can sleep between 7 to 9 hours.  She also reports of losing sensation in her rectum area, she does have episodes of stool incontinence.  She has been seen by GI. When driving, she went past her destination at time (grocery store), reports some difficulty with recipes, has to check and recheck.    TBI: History of concussion  Stroke:   no past history of stroke Seizures:   no past history of seizures Sleep:   no history of sleep apnea.  Mood: Not formally diagnosed but felt depressed when she moved back from [place] 1.5 year ago  Family history of Dementia: Father with memory loss in the last 5  years in his life, he lived in his mid 53s.    Functional status: independent in all ADLs and IADLs Patient lives with husband. Cooking: yes, but has to check the recipe all the time  Cleaning: no issues  Shopping: no issues  Bathing: no issues  Toileting: no issues, sometimes has bowel incontinence  Driving: no recent accident Bills: Forgot to pay bills once  Medications: no issues  Ever left the stove on by accident?: Denies  Forget how to use items around the house?: Denies  Getting lost going to familiar places?: Denies  Forgetting loved ones names?: Denies  Word finding difficulty? Yes Sleep: 9 hrs, but would wake 2 to 3 times per night    OTHER MEDICAL CONDITIONS: Atrial fibrillation, Hypertension, Hyperlipidemia, Depression   REVIEW OF SYSTEMS: Full 14 system review of systems performed and negative with exception of: As noted in the HPI  ALLERGIES: Allergies  Allergen Reactions   Latex     Excoriated skin  -   Tegaderm.   Sulfa Antibiotics Hives   Other     tegaderm dermatitis   Shrimp [Shellfish Allergy] Other (See Comments)    HOME MEDICATIONS: Outpatient Medications Prior to Visit  Medication Sig Dispense Refill   ALPRAZolam (XANAX) 0.25 MG tablet Take 0.25 mg by mouth at bedtime as needed for anxiety.     BEPREVE 1.5 % SOLN 2 (two) times daily as needed.  dexlansoprazole (DEXILANT) 60 MG capsule Take 60 mg by mouth as needed.      DYMISTA 137-50 MCG/ACT SUSP 2 (two) times daily as needed.      flecainide (TAMBOCOR) 100 MG tablet Take 100 mg  2 tablets if needed for Afib then take 100 mg 1 tablet 12 hours later and may take 100 mg 1 tablet 12 hr later if still in Afib Up to 6 doses.  Once afib breaks take 50 mg  two doses 12 hour apart then stop 180 tablet 3   glycopyrrolate (ROBINUL) 2 MG tablet Take 1 mg by mouth daily.      hydrochlorothiazide (MICROZIDE) 12.5 MG capsule Take 1 capsule (12.5 mg total) by mouth daily. 90 capsule 3   hyoscyamine (LEVSIN,  ANASPAZ) 0.125 MG tablet Take 0.25 mg by mouth as needed. Takes 0.250 MG PRN     metoprolol succinate (TOPROL XL) 25 MG 24 hr tablet Take 1 tablet (25 mg total) by mouth daily. 90 tablet 3   metoprolol tartrate (LOPRESSOR) 25 MG tablet Take 1 tablet (25 mg total) by mouth as needed. Use with Flecainide     montelukast (SINGULAIR) 10 MG tablet daily.      rivaroxaban (XARELTO) 20 MG TABS tablet Take 1 tablet (20 mg total) by mouth daily with supper. 90 tablet 3   rosuvastatin (CRESTOR) 10 MG tablet Take 1 tablet (10 mg total) by mouth daily. (Patient not taking: Reported on 12/04/2022) 90 tablet 3   No facility-administered medications prior to visit.    PAST MEDICAL HISTORY: Past Medical History:  Diagnosis Date   Abnormal Pap smear of cervix    yrs ago   Atrial fibrillation (HCC)    GERD (gastroesophageal reflux disease)    Followed by Dr. Randa Evens. Plan to repeat EGD: In 2021   Heart palpitations    Hyperlipidemia    Hypertension    previous hx   IBS (irritable bowel syndrome)    Malignant melanoma (HCC) 2006   History   Migraine    PAC (premature atrial contraction)    Rectal leakage    Simple endometrial hyperplasia without atypia 08/18/2013   Skin cancer 09/30/2004   malignant melanoma.    PAST SURGICAL HISTORY: Past Surgical History:  Procedure Laterality Date   CARDIOPULMONARY EXERCISE TEST  09/02/2017   No suggestion of any pulmonary or circulatory limitation to exercise with normal functional capacity but there is concern for EKG changes there were still nondiagnostic.  Recommendation was to check a coronary CT angiogram   CERVICAL CERCLAGE     CHOLECYSTECTOMY  1980   CORONARY CT ANGIOGRAM  09/2017    Aortic atherosclerosis noted.  Calcium score 90.  Mild nonobstructive disease in the LAD less than 50%.  Minimal disease in the RCA.  Recommend aggressive risk factor modification.   ENDOMETRIAL BIOPSY  2010   negative atypia/hyperplasia   Holter Monitor  06/2016    24 - [facility], [place]) close to 119,000 beats. Minimum heart rate 66, average 84, maximum 143 of sinus rhythm. No PVCs noted no VT noted. Just over 16,000 supraventricular contractions/PACs noted. 3 episodes of greater than 10 beats paroxysmal atrial tachycardia (PAT - with max rate of 170 bpm). --> Higher incidence of PACs noted in 19 hours.   MELANOMA EXCISION  2006   OTHER SURGICAL HISTORY     full buttock revision   SHOULDER ARTHROSCOPY     left 2011/right 2012   TRANSTHORACIC ECHOCARDIOGRAM  06/23/2016   [facility] ([place]): Normal LV size and function. Hyperdynamic LV function (has EF of 82% recorded) mild aortic regurgitation and mild mitral regurgitation. Mild/borderline grade 1 diastolic dysfunction. Otherwise normal wall thickness.   WRIST SURGERY     rt wrist    FAMILY HISTORY: Family History  Problem Relation Age of Onset   Breast cancer Mother    Cancer Mother        bladder cancer,  precancerous moles.   Hypertension Mother    Coronary artery disease Mother 12       PCI 3 - angina   Hyperlipidemia Mother    Cancer Father        bladder   Hypertension Father    Bipolar disorder Father    Dementia Father    Cancer Sister        has atypical moles.   Diabetes Sister    Scleroderma Sister    Hypercholesterolemia Sister    Alcohol abuse Brother    Leukemia Maternal Grandmother 59    SOCIAL HISTORY: Social History   Socioeconomic History   Marital status: Married    Spouse name: Not on file   Number of children: 2   Years of education: Not on file   Highest education level: Not on file  Occupational History   Not on file  Tobacco Use   Smoking status: Never   Smokeless tobacco: Never  Substance and Sexual Activity   Alcohol use: Yes    Alcohol/week: 7.0 - 10.0 standard drinks of alcohol    Types: 7 - 10 Glasses of wine per week   Drug use: No   Sexual activity: Yes    Partners: Male    Comment: husband vasectomy  Other Topics  Concern   Not on file  Social History Narrative   She is a married mother of 2 children, no grandchildren. 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 She lives with her husband.  They have recently moved back from having worked in [employer] living there for 17 years..    She has a BS/BSN.   She enjoys walking at least 50 minutes to an hour to 3 days a week. When she doesn't do it is because either too hot, too cold or she just feels too lazy.   Social Determinants of Health   Financial Resource Strain: Not on file  Food Insecurity: Not on file  Transportation Needs: Not on file  Physical Activity: Not on file  Stress: Not on file  Social Connections: Not on file  Intimate Partner Violence: Not on file    PHYSICAL EXAM  GENERAL EXAM/CONSTITUTIONAL: Vitals:  Vitals:   01/21/23 1045 01/21/23 1051  BP: (!) 153/85 134/83  Pulse: 65   Weight: 135 lb 8 oz (61.5 kg)   Height: 5' 3.75" (1.619 m)    Body mass index is 23.44 kg/m. Wt Readings from Last 3 Encounters:  01/21/23 135 lb 8 oz (61.5 kg)  12/04/22 136 lb 4.8 oz (61.8 kg)  11/25/22 138 lb 3.2 oz (62.7 kg)   Patient is in no distress; well developed, nourished and groomed; neck is supple  MUSCULOSKELETAL: Gait, strength, tone, movements noted in Neurologic exam below  NEUROLOGIC: MENTAL STATUS:      No data to display         awake, alert, oriented to person, place and time recent and remote memory intact normal attention and concentration language fluent, comprehension intact, naming intact fund of knowledge appropriate     01/21/2023  10:46 AM  [employer] (0/5) 5  Naming (0/3) 3  Attention: Read list of digits (0/2) 2  Attention: Read list of letters (0/1) 1  Attention: Serial 7 subtraction starting at 100 (0/3) 3  Language: Repeat phrase (0/2) 2  Language : Fluency (0/1) 1  Abstraction (0/2) 2  Delayed Recall (0/5) 3  Orientation (0/6) 6  Total 28    CRANIAL NERVE:  2nd, 3rd, 4th, 6th- visual fields full to confrontation, extraocular muscles intact, no nystagmus 5th - facial sensation symmetric 7th - facial strength symmetric 8th - hearing intact 9th - palate elevates symmetrically, uvula midline 11th - shoulder shrug symmetric 12th - tongue protrusion midline  MOTOR:  normal bulk and tone, full strength in the BUE, BLE  SENSORY:  normal and symmetric to light touch  COORDINATION:  finger-nose-finger, fine finger movements normal  GAIT/STATION:  normal   DIAGNOSTIC DATA (LABS, IMAGING, TESTING) - I reviewed patient records, labs, notes, testing and imaging myself where available.  Lab Results  Component Value Date   WBC 5.9 11/18/2015   HGB 12.7 11/18/2015   HCT 37.3 11/18/2015   MCV 85.7 11/18/2015   PLT 245 11/18/2015      Component Value Date/Time   NA 133 (L) 12/04/2020 0926   NA 135 (L) 11/18/2015 1340   K 4.3 12/04/2020 0926   K 3.3 (L) 11/18/2015 1340   CL 93 (L) 12/04/2020 0926   CO2 24 12/04/2020 0926   CO2 30 (H) 11/18/2015 1340   GLUCOSE 134 (H) 12/04/2020 0926   GLUCOSE 102 11/18/2015 1340   BUN 12 12/04/2020 0926   BUN 11.7 11/18/2015 1340   CREATININE 0.67 12/04/2020 0926   CREATININE 0.8 11/18/2015 1340   CALCIUM 10.0 12/04/2020 0926   CALCIUM 9.4 11/18/2015 1340   PROT 7.2 12/04/2020 0926   PROT 6.9 11/18/2015 1340   ALBUMIN 5.2 (H) 12/04/2020 0926   ALBUMIN 3.8 11/18/2015 1340   AST 27 12/04/2020 0926   AST 17 11/18/2015 1340   ALT 33 (H) 12/04/2020 0926   ALT 13 11/18/2015 1340   ALKPHOS 29 (L) 12/04/2020 0926   ALKPHOS 27 (L) 11/18/2015 1340   BILITOT 0.4 12/04/2020 0926   BILITOT <0.30 11/18/2015 1340   GFRNONAA 88 09/20/2017 1550   GFRAA 101 09/20/2017 1550   Lab Results  Component Value Date   CHOL 211 (H) 12/04/2020   HDL 73 12/04/2020   LDLCALC 125 (H) 12/04/2020   TRIG 72 12/04/2020   CHOLHDL 2.9 12/04/2020   No results found for: "HGBA1C" Lab Results   Component Value Date   VITAMINB12 368 02/22/2008   Lab Results  Component Value Date   TSH 1.697 09/26/2012     ASSESSMENT AND PLAN  67 y.o. year old female with history of atrial fibrillation, hypertension, hyperlipidemia, depression who is presenting with memory loss for the past 2 years, worse when she moved back in the [place] from [place], at that time also experiencing symptoms of depression.  She tells me that she is forgetful, is repetitive, has to check and recheck things all the time.  Her neurological exam is nonfocal, her MoCA was normal at 28.  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  However we will still do the dementia workup including TSH, B12, ATN profile, and ApoE4 genotype.  I will obtain a brain MRI and refer patient for a formal neuropsychological testing to evaluate between dementia  versus pseudodementia due to depression.  I will contact patient to go over the results, otherwise I will see her in 1 year for follow-up or sooner if worse.  She voiced understanding.   1. Memory loss   2. Word finding difficulty      Patient Instructions  Dementia lab including TSH, B12, ATN profile and APO E4 genotype MRI brain without contrast I will contact you to go over the result Referral for formal neuropsychological testing Discussed ways the reduce the risk of dementia including keeping a good health, diet and exercise, at least 20 minutes per day, 5 days a week.  Continue to follow-up with PCP Return in 6 months or sooner if worse.  There are well-accepted and sensible ways to reduce risk for Alzheimers disease and other degenerative brain disorders .  Exercise Daily Walk A daily 20 minute walk should be part of your routine. Disease related apathy can be a significant roadblock to exercise and the only way to overcome this is to make it a daily routine and perhaps have a reward at the end (something your loved  one loves to eat or drink perhaps) or a personal trainer coming to the home can also be very useful. Most importantly, the patient is much more likely to exercise if the caregiver / spouse does it with him/her. In general a structured, repetitive schedule is best.  General Health: Any diseases which effect your body will effect your brain such as a pneumonia, urinary infection, blood clot, heart attack or stroke. Keep contact with your primary care doctor for regular follow ups.  Sleep. A good nights sleep is healthy for the brain. Seven hours is recommended. If you have insomnia or poor sleep habits we can give you some instructions. If you have sleep apnea wear your mask.  Diet: Eating a heart healthy diet is also a good idea; fish and poultry instead of red meat, nuts (mostly non-peanuts), vegetables, fruits, olive oil or canola oil (instead of butter), minimal salt (use other spices to flavor foods), whole grain rice, bread, cereal and pasta and wine in moderation.Research is now showing that the MIND diet, which is a combination of The Mediterranean diet and the DASH diet, is beneficial for cognitive processing and longevity. Information about this diet can be found in The MIND Diet, a book by Alonna Minium, MS, RDN, and online at WildWildScience.es  Finances, Power of [address] and Advance Directives: You should consider putting legal safeguards in place with regard to financial and medical decision making. While the spouse always has power of attorney for medical and financial issues in the absence of any form, you should consider what you want in case the spouse / caregiver is no longer around or capable of making decisions.     Orders Placed This Encounter  Procedures   MR BRAIN WO CONTRAST   TSH   Vitamin B12   ATN PROFILE   APOE Alzheimer's Risk   Ambulatory referral to Neuropsychology    No orders of the defined types were placed in this  encounter.   Return in about 1 year (around 01/21/2024).  I have spent a total of 65 minutes dedicated to this patient today, preparing to see patient, performing a medically appropriate examination and evaluation, ordering tests and/or medications and procedures, and counseling and educating the patient/family/caregiver; independently interpreting result and communicating results to the family/patient/caregiver; and documenting clinical information in the electronic medical record.   Windell Norfolk, MD 01/21/2023, 1:14 PM  [facility] [address] [place], [place] [address] [phone]

## 2023-01-21 NOTE — Telephone Encounter (Signed)
sent to GI they obtain [employer] auth [id number] 

## 2023-01-21 NOTE — Telephone Encounter (Signed)
Referral for neuropsychology fax to [facility]. Phone:[phone], Fax: [phone]

## 2023-01-28 ENCOUNTER — Other Ambulatory Visit (HOSPITAL_COMMUNITY): Payer: Managed Care, Other (non HMO)

## 2023-01-29 ENCOUNTER — Ambulatory Visit (HOSPITAL_COMMUNITY): Payer: Managed Care, Other (non HMO) | Attending: Cardiology

## 2023-01-29 DIAGNOSIS — I351 Nonrheumatic aortic (valve) insufficiency: Secondary | ICD-10-CM | POA: Insufficient documentation

## 2023-01-29 DIAGNOSIS — I480 Paroxysmal atrial fibrillation: Secondary | ICD-10-CM | POA: Diagnosis present

## 2023-01-29 LAB — ECHOCARDIOGRAM COMPLETE
Area-P 1/2: 3.53 cm2
P 1/2 time: 423 ms
S' Lateral: 2.5 cm

## 2023-02-02 LAB — APOE ALZHEIMER'S RISK

## 2023-02-02 LAB — ATN PROFILE
A -- Beta-amyloid 42/40 Ratio: 0.136 (ref >0.102)
Beta-amyloid 40: 165.69 pg/mL
Beta-amyloid 42: 22.52 pg/mL
N -- NfL, Plasma: 2.6 pg/mL (ref 0.00–4.61)
T -- p-tau181: 0.77 pg/mL (ref 0.00–0.97)

## 2023-02-02 LAB — VITAMIN B12: Vitamin B-12: 441 pg/mL (ref 232–1245)

## 2023-02-02 LAB — TSH: TSH: 1.55 u[IU]/mL (ref 0.450–4.500)

## 2023-02-17 ENCOUNTER — Encounter: Payer: Self-pay | Admitting: Neurology

## 2023-02-22 ENCOUNTER — Encounter: Payer: Self-pay | Admitting: Neurology

## 2023-02-25 ENCOUNTER — Ambulatory Visit
Admission: RE | Admit: 2023-02-25 | Discharge: 2023-02-25 | Disposition: A | Discharge status: Home / Self Care | Payer: Managed Care, Other (non HMO) | Source: Ambulatory Visit | Attending: Neurology | Admitting: Neurology

## 2023-02-25 DIAGNOSIS — R4789 Other speech disturbances: Secondary | ICD-10-CM

## 2023-02-25 DIAGNOSIS — R413 Other amnesia: Secondary | ICD-10-CM

## 2023-03-02 ENCOUNTER — Telehealth: Payer: Self-pay | Admitting: Neurology

## 2023-03-02 NOTE — Telephone Encounter (Signed)
Pt requesting to move 1 year flu, 01/20/24 to earlier in the 2025 year due to have received MRI results

## 2023-03-03 NOTE — Telephone Encounter (Signed)
She does not need to be wait listed. She does not have dementia and all her work up are within normal limits. I can see her in July.

## 2023-03-10 ENCOUNTER — Other Ambulatory Visit: Payer: Self-pay | Admitting: Cardiology

## 2023-03-11 ENCOUNTER — Encounter: Payer: Self-pay | Admitting: Cardiology

## 2023-03-11 DIAGNOSIS — I480 Paroxysmal atrial fibrillation: Secondary | ICD-10-CM

## 2023-03-11 MED ORDER — HYDROCHLOROTHIAZIDE 12.5 MG PO CAPS: 12.5000 mg | ORAL_CAPSULE | Freq: Every day | ORAL | 3 refills | Status: AC

## 2023-03-18 MED ORDER — RIVAROXABAN 20 MG PO TABS: 20.0000 mg | ORAL_TABLET | Freq: Every day | ORAL | 1 refills | Status: DC

## 2023-03-18 NOTE — Addendum Note (Signed)
Addended by: Cheree Ditto on: 03/18/2023 03:40 PM   Modules accepted: Orders

## 2023-04-30 ENCOUNTER — Other Ambulatory Visit: Payer: Self-pay

## 2023-04-30 ENCOUNTER — Ambulatory Visit: Payer: Managed Care, Other (non HMO) | Attending: Cardiology | Admitting: Cardiology

## 2023-04-30 ENCOUNTER — Encounter: Payer: Self-pay | Admitting: Cardiology

## 2023-04-30 VITALS — BP 126/78 | HR 59 | Ht 64.0 in | Wt 138.4 lb

## 2023-04-30 DIAGNOSIS — Z8249 Family history of ischemic heart disease and other diseases of the circulatory system: Secondary | ICD-10-CM | POA: Diagnosis not present

## 2023-04-30 DIAGNOSIS — I480 Paroxysmal atrial fibrillation: Secondary | ICD-10-CM

## 2023-04-30 DIAGNOSIS — D6869 Other thrombophilia: Secondary | ICD-10-CM | POA: Diagnosis not present

## 2023-04-30 DIAGNOSIS — I10 Essential (primary) hypertension: Secondary | ICD-10-CM | POA: Diagnosis not present

## 2023-04-30 DIAGNOSIS — E782 Mixed hyperlipidemia: Secondary | ICD-10-CM

## 2023-04-30 DIAGNOSIS — I340 Nonrheumatic mitral (valve) insufficiency: Secondary | ICD-10-CM

## 2023-04-30 MED ORDER — METOPROLOL TARTRATE 25 MG PO TABS: ORAL_TABLET | ORAL | 2 refills | Status: DC

## 2023-04-30 MED ORDER — METOPROLOL SUCCINATE ER 25 MG PO TB24: 25.0000 mg | ORAL_TABLET | Freq: Every day | ORAL | 3 refills | Status: AC

## 2023-04-30 MED ORDER — METOPROLOL TARTRATE 25 MG PO TABS: ORAL_TABLET | ORAL | 3 refills | Status: AC

## 2023-04-30 NOTE — Assessment & Plan Note (Signed)
 Her echocardiogram performed while living in [place] and suggested possible aortic regurgitation.  Ectopy above showed moderate MR and mild AI.  Will continue to monitor intermittent echocardiograms.

## 2023-04-30 NOTE — Patient Instructions (Signed)
 Medication Instructions:  Your physician recommends that you continue on your current medications as directed. Please refer to the Current Medication list given to you today.    *If you need a refill on your cardiac medications before your next appointment, please call your pharmacy*   Lab Work: None    If you have labs (blood work) drawn today and your tests are completely normal, you will receive your results only by: MyChart Message (if you have MyChart) OR A paper copy in the mail If you have any lab test that is abnormal or we need to change your treatment, we will call you to review the results.   Testing/Procedures:  None  Follow-Up: At [facility], you and your health needs are our priority.  As part of our continuing mission to provide you with exceptional heart care, we have created designated Provider Care Teams.  These Care Teams include your primary Cardiologist (physician) and Advanced Practice Providers (APPs -  Physician Assistants and Nurse Practitioners) who all work together to provide you with the care you need, when you need it.  We recommend signing up for the patient portal called MyChart.  Sign up information is provided on this After Visit Summary.  MyChart is used to connect with patients for Virtual Visits (Telemedicine).  Patients are able to view lab/test results, encounter notes, upcoming appointments, etc.  Non-urgent messages can be sent to your provider as well.   To learn more about what you can do with MyChart, go to [email or web address].    Your next appointment:   8 month(s)  The format for your next appointment:   In Person  Provider:   Alm Clay, MD    Other Instructions

## 2023-04-30 NOTE — Assessment & Plan Note (Signed)
 Monthly episodes of rapid heart rate, responsive to Flecainide .  Toprol  dose reduced due to low resting heart rate. - resting HR now better with less fatigue.  -Continue current regimen of Toprol  XL 25mg  daily with Flecainide  as needed for breakthrough episodes. (as noted in PMH review)  Flecainide  100 mg tablets take 2 tablets at onset of A-fib and then additional 1 tablet 12 hours after.  May take up to 4 doses of the 100 mg every 12 hours, then would continue 50 mg twice daily for 4 doses following conversion  -Consider additional PRN Lopressor  (metoprolol  tartrate) 25 mg during episodes of rapid heart rate (>140 bpm). -Discussed potential for ablation procedure in the future if patient becomes dissatisfied with current management.

## 2023-04-30 NOTE — Assessment & Plan Note (Signed)
 CAC score of 19 with a very reassuring CPX.  No active symptoms. Continue risk factor modification: BP controlled, and lipids controlled on her current dose in the past, but there was a bump in the wrong direction when she held it in the fall to assess for potential involvement in her memory issues.

## 2023-04-30 NOTE — Assessment & Plan Note (Signed)
 CHA2DS2-VASc score is 3.  Has been on Xarelto  but no major issues.  Okay to hold Xarelto  2 to 3 days preop for surgeries or procedures

## 2023-04-30 NOTE — Assessment & Plan Note (Addendum)
 Hyperlipidemia Recent increase in cholesterol levels after a "statin holiday".  However, she restarted after 1 month b/c no change in memory issues.  -Resume statin therapy and expect cholesterol levels to decrease. -Recheck lipid panel in the fall.

## 2023-04-30 NOTE — Progress Notes (Signed)
 Cardiology Office Note:  .   Date:  04/30/2023  ID:  Kristina Delgado, DOB 1955/11/02, MRN [id number] PCP: Katina Pfeiffer, PA-C  [facility] Providers Cardiologist:  Alm Clay, MD     Chief Complaint  Patient presents with   Follow-up   Atrial Fibrillation    ~1 per month - breaks with 2nd dose Flecainide     Patient Profile: .     Kristina Delgado is a  68 y.o. female with a PMH notable for PAF, HTN, HLD as well as mild AI and family history of CAD (with reassuring CTA and CPX) who presents here for 4 month f/u at the request of Katina, Courtney, PA-C.  PAF: Toprol  25 mg daily with PRN Lopressor  25 mg for breakthrough spells; Xarelto  PRN flecainide  for breakthrough spells: 2 tabs at onset of A-fib followed by 1 tablet every 12 hours up to 4 doses until converted, then 50 mg twice daily for 4 doses following conversion. HTN on Toprol  25 mg and HCTZ 12.5 mg. HLD-most recent LDL of 83 =-> recent statin holiday   Kristina Delgado was last seen on December 04, 2022:-Doing well with exception of 3 successive episodes of A-fib in 1 week.  Each time broke with a combination of flecainide  and metoprolol .  She decided to go back to the full dose of beta-blocker, but concerned with resting heart rate in the 40s.  Mild orthostatic dizziness.  Had lost 20 pounds intentionally.  Maintaining adequate hydration.  Was upset because a close family friend and had a car accident.  Had some atrial fibrillation that. => Was undergoing neurologic evaluation for cognitive issues and per phone discussions we had had her stop her rosuvastatin . CV ROS positive for palpitations/A-fib each time breaking with flecainide .  Occasional ectopy but short-lived spells.  Also noted 20 pound weight loss.  She was seen on January 21, 2023 at the neurology clinic for evaluation of memory issues.  She noted significant depression upon initially back to the [place]  for the first 6 months or so.  Did  not go out of the house.  She started noticing issues with word finding and trouble remembering.  Constantly ended check and recheck things.  Family notes that she is often repetitive.  Not sleeping well.  Sometimes just past her destination while driving. 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  However dementia panel ordered.  Subjective  Discussed the use of AI scribe software for clinical note transcription with the patient, who gave verbal consent to proceed.  History of Present Illness   Kristina Delgado is a 68 year old female with paroxysmal atrial fibrillation who presents for follow-up regarding her arrhythmia management.  She monitors her heart with a [employer] device and notes that she experiences episodes of atrial fibrillation approximately once a month, with heart rates reaching 110-130 bpm. During these episodes, she takes flecainide , which typically resolves the symptoms by the second dose. She is on metoprolol  XL 25 mg once daily, reduced from 50 mg due to low heart rate concerns.    Other than these Afib episodes, she may have random irregular heartbeats, but not usually prolonged.  She denies chest pain, pressure, tightness, or shortness of breath during episodes or otherwise. She notes cramping in her lower legs and ankles, attributed to certain shoes.  She has a history of elevated cholesterol, which increased significantly after stopping a statin - having paused to assess concerns of memory loss.  She has resumed the statin, and cholesterol levels are expected to decrease. A recheck of her cholesterol levels is scheduled for the fall.  Her blood sugar levels have been borderline high, with readings around 90 for years, and are being monitored by her healthcare provider. No blood in stool, epistaxis, or swelling in her legs.       Objective   Cardiac Medications:  - PRN Flecainide  100 mg BID x 4 doses - PRN Metoprolol  25 mg  for breakthrough Afib RVR - Metoprolol  Succinate (Toprol  XL) 25 mg daily - Xarelto  20 mg daily - Rosuvastatin  10 mg daily - HCTZ 12.5 mg PRN edema  Studies Reviewed: SABRA   EKG Interpretation Date/Time:  Friday April 30 2023 09:27:22 EST Ventricular Rate:  59 PR Interval:  180 QRS Duration:  80 QT Interval:  442 QTC Calculation: 437 R Axis:   0  Text Interpretation: Sinus bradycardia When compared with ECG of 04-Dec-2022 09:01, No significant change was found Confirmed by Anner Lenis ([id number]) on 04/30/2023 9:39:10 AM    ECHO (01/29/2023): EF 60 to 65%.  No RWMA.  Mild LA dilation.  Moderate RA dilation.  Moderate MR.  Mild AoV calcification/sclerosis but no stenosis.  Mild AI. (Results reviewed with the patient via Results Review)   Previous Pertinent Studies Coronary CTA (09/28/2017): CAC 90.  Mild mid LAD stenosis (25-50%).  Minimal disease in proximal RCA. CPX (09/04/2017): Normal functional capacity.  No pulmonary limitation exercise.  Borderline upsloping ST segments that do not meet diagnostic criteria.  Risk Assessment/Calculations:    CHA2DS2-VASc Score = 3   This indicates a 3.2% annual risk of stroke. The patient's score is based upon: CHF History: 0 HTN History: 1 Diabetes History: 0 Stroke History: 0 Vascular Disease History: 0 Age Score: 1 Gender Score: 1   She is on DOAC         Physical Exam:   VS:  BP 126/78 (BP Location: Left Arm, Patient Position: Sitting)   Pulse (!) 59   Ht 5' 4 (1.626 m)   Wt 138 lb 6.4 oz (62.8 kg)   LMP 08/08/2013   BMI 23.76 kg/m    Wt Readings from Last 3 Encounters:  04/30/23 138 lb 6.4 oz (62.8 kg)  01/21/23 135 lb 8 oz (61.5 kg)  12/04/22 136 lb 4.8 oz (61.8 kg)    GEN: Well nourished, well groomed; in no acute distress; healthy besides dealing with URI Sx NECK: No JVD; No carotid bruits CARDIAC: Normal S1, S2; RRR, no murmurs, rubs, gallops RESPIRATORY:  Clear to auscultation without rales, wheezing or rhonchi ;  nonlabored, good air movement. ABDOMEN: Soft, non-tender, non-distended EXTREMITIES:  No edema; No deformity     ASSESSMENT AND PLAN: .    Problem List Items Addressed This Visit       Cardiology Problems   Essential hypertension - Primary (Chronic)   BP well-controlled on low-dose HCTZ 12.5 mg along with reduced dose of 25 mg Toprol . -Continue current medications. -If pressures were to increase, would increase HCTZ to 25 mg      Relevant Orders   EKG 12-Lead (Completed)   Hypercoagulable state due to paroxysmal atrial fibrillation (HCC); CHA2DS2-VASc score 3 (CAD, age 59, female) (Chronic)   CHA2DS2-VASc score is 3.  Has been on Xarelto  but no major issues.  Okay to hold Xarelto  2 to 3 days preop for surgeries or procedures      Relevant Orders   EKG 12-Lead (Completed)   Hyperlipidemia, mixed (Chronic)  Hyperlipidemia Recent increase in cholesterol levels after a statin holiday.  However, she restarted after 1 month b/c no change in memory issues.  -Resume statin therapy and expect cholesterol levels to decrease. -Recheck lipid panel in the fall.      Relevant Orders   EKG 12-Lead (Completed)   Mitral regurgitation (Chronic)   Her echocardiogram performed while living in [place] and suggested possible aortic regurgitation.  Ectopy above showed moderate MR and mild AI.  Will continue to monitor intermittent echocardiograms.      Paroxysmal atrial fibrillation (HCC) (Chronic)   Monthly episodes of rapid heart rate, responsive to Flecainide .  Toprol  dose reduced due to low resting heart rate. - resting HR now better with less fatigue.  -Continue current regimen of Toprol  XL 25mg  daily with Flecainide  as needed for breakthrough episodes. (as noted in PMH review)  Flecainide  100 mg tablets take 2 tablets at onset of A-fib and then additional 1 tablet 12 hours after.  May take up to 4 doses of the 100 mg every 12 hours, then would continue 50 mg twice daily for 4 doses  following conversion  -Consider additional PRN Lopressor  (metoprolol  tartrate) 25 mg during episodes of rapid heart rate (>140 bpm). -Discussed potential for ablation procedure in the future if patient becomes dissatisfied with current management.      Relevant Orders   EKG 12-Lead (Completed)     Other   Family history of premature CAD (Chronic)   CAC score of 19 with a very reassuring CPX.  No active symptoms. Continue risk factor modification: BP controlled, and lipids controlled on her current dose in the past, but there was a bump in the wrong direction when she held it in the fall to assess for potential involvement in her memory issues.      Relevant Orders   EKG 12-Lead (Completed)      Follow-Up: Return in about 7 months (around 11/28/2023).     Signed, Alm MICAEL Clay, MD, MS Alm Clay, M.D., M.S. Interventional Cardiologist  [facility]  Pager # [phone] Phone # [phone] [address] [place], [place] [address]

## 2023-04-30 NOTE — Assessment & Plan Note (Signed)
 BP well-controlled on low-dose HCTZ 12.5 mg along with reduced dose of 25 mg Toprol . -Continue current medications. -If pressures were to increase, would increase HCTZ to 25 mg

## 2023-06-10 ENCOUNTER — Encounter: Payer: Self-pay | Admitting: Cardiology

## 2023-09-15 ENCOUNTER — Other Ambulatory Visit: Payer: Self-pay | Admitting: Cardiology

## 2023-09-15 DIAGNOSIS — I480 Paroxysmal atrial fibrillation: Secondary | ICD-10-CM

## 2023-09-15 DIAGNOSIS — Z79899 Other long term (current) drug therapy: Secondary | ICD-10-CM

## 2023-09-15 NOTE — Telephone Encounter (Signed)
 Prescription refill request for Xarelto  received.  Indication: PAF Last office visit: 04/30/23  JONETTA Clay MD Weight: 62.8kg Age: 68 Scr: 0.67 on 12/04/20 CrCl: 78.57  Based on above findings Xarelto  20mg  daily is the appropriate dose.  Pt is past due for labs.  Message sent to Dr Genice nurse to arrange lab work.  Refill approved x 1 only.

## 2023-09-16 NOTE — Addendum Note (Signed)
 Addended by: ORLANDO SHIRL SAUNDERS on: 09/16/2023 02:55 PM   Modules accepted: Orders

## 2023-09-16 NOTE — Telephone Encounter (Signed)
 Patient identification verified by 2 forms. Shirl Ellen, RN    Called patient. No answer. Left message informing patient of required labwork for Xarelto  refill.  BMP and CBC order placed.

## 2023-09-17 ENCOUNTER — Encounter: Payer: Self-pay | Admitting: Cardiology

## 2023-09-20 ENCOUNTER — Ambulatory Visit: Payer: Self-pay | Admitting: Emergency Medicine

## 2023-09-20 DIAGNOSIS — Z79899 Other long term (current) drug therapy: Secondary | ICD-10-CM

## 2023-09-20 NOTE — Progress Notes (Signed)
 Order for lipid panel to be collected at [facility] put in.

## 2023-09-24 LAB — BASIC METABOLIC PANEL WITH GFR
BUN/Creatinine Ratio: 14 (ref 12–28)
BUN: 9 mg/dL (ref 8–27)
CO2: 24 mmol/L (ref 20–29)
Calcium: 9.9 mg/dL (ref 8.7–10.3)
Chloride: 95 mmol/L — ABNORMAL LOW (ref 96–106)
Creatinine, Ser: 0.66 mg/dL (ref 0.57–1.00)
Glucose: 87 mg/dL (ref 70–99)
Potassium: 4.4 mmol/L (ref 3.5–5.2)
Sodium: 134 mmol/L (ref 134–144)
eGFR: 95 mL/min/1.73 (ref >59)

## 2023-09-24 LAB — CBC
Hematocrit: 41.8 % (ref 34.0–46.6)
Hemoglobin: 14 g/dL (ref 11.1–15.9)
MCH: 30.4 pg (ref 26.6–33.0)
MCHC: 33.5 g/dL (ref 31.5–35.7)
MCV: 91 fL (ref 79–97)
Platelets: 283 x10E3/uL (ref 150–450)
RBC: 4.61 x10E6/uL (ref 3.77–5.28)
RDW: 11.5 % — ABNORMAL LOW (ref 11.7–15.4)
WBC: 4.4 x10E3/uL (ref 3.4–10.8)

## 2023-09-24 LAB — LIPID PANEL
Chol/HDL Ratio: 2.6 ratio (ref 0.0–4.4)
Cholesterol, Total: 146 mg/dL (ref 100–199)
HDL: 57 mg/dL (ref >39)
LDL Chol Calc (NIH): 75 mg/dL (ref 0–99)
Triglycerides: 70 mg/dL (ref 0–149)
VLDL Cholesterol Cal: 14 mg/dL (ref 5–40)

## 2023-10-01 ENCOUNTER — Ambulatory Visit: Payer: Self-pay | Admitting: Cardiology

## 2023-10-20 ENCOUNTER — Ambulatory Visit: Payer: Managed Care, Other (non HMO) | Admitting: Neurology

## 2023-10-26 DIAGNOSIS — R419 Unspecified symptoms and signs involving cognitive functions and awareness: Secondary | ICD-10-CM | POA: Insufficient documentation

## 2023-10-26 DIAGNOSIS — F4323 Adjustment disorder with mixed anxiety and depressed mood: Secondary | ICD-10-CM | POA: Insufficient documentation

## 2023-11-24 ENCOUNTER — Other Ambulatory Visit: Payer: Self-pay | Admitting: Family Medicine

## 2023-11-24 DIAGNOSIS — Z1231 Encounter for screening mammogram for malignant neoplasm of breast: Secondary | ICD-10-CM

## 2023-12-07 ENCOUNTER — Other Ambulatory Visit: Payer: Self-pay | Admitting: Cardiology

## 2023-12-07 ENCOUNTER — Ambulatory Visit

## 2023-12-07 DIAGNOSIS — I480 Paroxysmal atrial fibrillation: Secondary | ICD-10-CM

## 2023-12-07 NOTE — Telephone Encounter (Signed)
 Prescription refill request for Xarelto  received.  Indication: AF Last office visit: 04/30/23  Kristina Clay MD Weight: 62.8kg Age: 68 Scr: 0.66 on 09/23/23 CrCl: 79.76  Based on above findings Xarelto  20mg  daily is the appropriate dose.  Refill approved.

## 2023-12-14 ENCOUNTER — Encounter: Payer: Self-pay | Admitting: Cardiology

## 2023-12-21 ENCOUNTER — Ambulatory Visit
Admission: RE | Admit: 2023-12-21 | Discharge: 2023-12-21 | Disposition: A | Discharge status: Home / Self Care | Source: Ambulatory Visit | Attending: Family Medicine | Admitting: Family Medicine

## 2023-12-21 DIAGNOSIS — Z1231 Encounter for screening mammogram for malignant neoplasm of breast: Secondary | ICD-10-CM

## 2024-01-02 ENCOUNTER — Ambulatory Visit: Payer: Self-pay | Admitting: Cardiology

## 2024-01-02 NOTE — Progress Notes (Signed)
 Lab results: From PCP December 14, 2023 versus July 2025 unfortunately, lipid panel does not look as good with total cholesterol increased up to 182 from 146, LDL up to 95 from 75.  Still acceptable, but not as good as it has been.  Continue to monitor.  Alm Clay, MD

## 2024-01-20 ENCOUNTER — Ambulatory Visit: Payer: Managed Care, Other (non HMO) | Admitting: Neurology

## 2024-01-27 ENCOUNTER — Other Ambulatory Visit (HOSPITAL_COMMUNITY)
Admission: RE | Admit: 2024-01-27 | Discharge: 2024-01-27 | Disposition: A | Source: Ambulatory Visit | Attending: Nurse Practitioner | Admitting: Nurse Practitioner

## 2024-01-27 ENCOUNTER — Ambulatory Visit (INDEPENDENT_AMBULATORY_CARE_PROVIDER_SITE_OTHER): Admitting: Nurse Practitioner

## 2024-01-27 ENCOUNTER — Encounter: Payer: Self-pay | Admitting: Nurse Practitioner

## 2024-01-27 VITALS — BP 120/78 | HR 72 | Ht 64.0 in | Wt 145.0 lb

## 2024-01-27 DIAGNOSIS — Z1331 Encounter for screening for depression: Secondary | ICD-10-CM

## 2024-01-27 DIAGNOSIS — Z01419 Encounter for gynecological examination (general) (routine) without abnormal findings: Secondary | ICD-10-CM | POA: Insufficient documentation

## 2024-01-27 DIAGNOSIS — Z124 Encounter for screening for malignant neoplasm of cervix: Secondary | ICD-10-CM

## 2024-01-27 DIAGNOSIS — Z780 Asymptomatic menopausal state: Secondary | ICD-10-CM

## 2024-01-27 DIAGNOSIS — Z1151 Encounter for screening for human papillomavirus (HPV): Secondary | ICD-10-CM | POA: Diagnosis not present

## 2024-01-27 NOTE — Progress Notes (Signed)
 Kristina Delgado Jun 14, 1955 [id number]   History:  68 y.o. [id number] presents for annual exam. Postmenopausal - no HRT, no bleeding. Abnormal pap many years ago without intervention. HTN, HLD, asthma managed by PCP, 2006 melanoma, a fib managed by cardiology, on Xarelto . Depression managed by PCP, rectal leakage managed by GI.   Gynecologic History Patient's last menstrual period was 08/08/2013.   Contraception/Family planning: post menopausal status Sexually active: No, husband had hip sx  Health Maintenance Last Pap: 10/11/2018. Results were: Normal neg HPV Last mammogram: 12/21/2023. Results were: Normal Last colonoscopy: 2021. Results were: Normal, 5-year recall Last Dexa: 12/18/2020. Results were: Normal  Past medical history, past surgical history, family history and social history were all reviewed and documented in the EPIC chart. Married. Lived in [place] x 16 years, moved back to [place]  in 2023. 2 sons, one local and one in [place].   ROS:  A ROS was performed and pertinent positives and negatives are included.  Exam:  Vitals:   01/27/24 1500  BP: 120/78  Pulse: 72  SpO2: 97%  Weight: 145 lb (65.8 kg)  Height: 5' 4 (1.626 m)     Body mass index is 24.89 kg/m.  General appearance:  Normal Thyroid:  Symmetrical, normal in size, without palpable masses or nodularity. Respiratory  Auscultation:  Clear without wheezing or rhonchi Cardiovascular  Auscultation:  Regular rate, without rubs, murmurs or gallops  Edema/varicosities:  Not grossly evident Abdominal  Soft,nontender, without masses, guarding or rebound.  Liver/spleen:  No organomegaly noted  Hernia:  None appreciated  Skin  Inspection:  Grossly normal Breasts: Examined lying and sitting.   Right: Without masses, retractions, nipple discharge or axillary adenopathy.   Left: Without masses, retractions, nipple discharge or axillary adenopathy. Pelvic: External genitalia:  no lesions              Urethra:   normal appearing urethra with no masses, tenderness or lesions              Bartholins and Skenes: normal                 Vagina: normal appearing vagina with normal color and discharge, no lesions. Atrophic changes              Cervix: no lesions Bimanual Exam:  Uterus:  no masses or tenderness              Adnexa: no mass, fullness, tenderness              Rectovaginal: Deferred              Anus:  normal, no lesions  [name], CMA present as chaperone.   Assessment/Plan:  68 y.o. [id number] for annual exam.   Well female exam with routine gynecological exam - Education provided on SBEs, importance of preventative screenings, current guidelines, high calcium  diet, regular exercise, and multivitamin daily.  Labs with PCP.   Postmenopausal - No HRT, no bleeding.   Cervical cancer screening - Plan: Cytology - PAP( [facility]).  Abnormal pap many years ago, no intervention required. Aware of current guidelines. Wants to continue screenings.   Screening for breast cancer - Normal mammogram history.  Continue annual screenings.  Normal breast exam today.  Screening for colon cancer - 2021 colonoscopy. Will repeat at GI's recommended interval.   Screening for osteoporosis - Normal bone density 11/2020.  Return in about 1 year (around 01/26/2025) for Annual.     Kristina DELENA Shutter DNP,  3:48 PM 01/27/2024

## 2024-01-28 ENCOUNTER — Ambulatory Visit: Payer: Self-pay | Admitting: Nurse Practitioner

## 2024-01-28 LAB — CYTOLOGY - PAP
Comment: NEGATIVE
Diagnosis: NEGATIVE
High risk HPV: NEGATIVE

## 2024-01-31 ENCOUNTER — Ambulatory Visit (INDEPENDENT_AMBULATORY_CARE_PROVIDER_SITE_OTHER): Admitting: Neurology

## 2024-01-31 ENCOUNTER — Telehealth: Payer: Self-pay | Admitting: Neurology

## 2024-01-31 ENCOUNTER — Encounter: Payer: Self-pay | Admitting: Neurology

## 2024-01-31 VITALS — BP 128/72 | Ht 64.0 in | Wt 144.0 lb

## 2024-01-31 DIAGNOSIS — R4789 Other speech disturbances: Secondary | ICD-10-CM | POA: Diagnosis not present

## 2024-01-31 DIAGNOSIS — F32A Depression, unspecified: Secondary | ICD-10-CM | POA: Diagnosis not present

## 2024-01-31 DIAGNOSIS — K2270 Barrett's esophagus without dysplasia: Secondary | ICD-10-CM | POA: Insufficient documentation

## 2024-01-31 DIAGNOSIS — K219 Gastro-esophageal reflux disease without esophagitis: Secondary | ICD-10-CM | POA: Insufficient documentation

## 2024-01-31 DIAGNOSIS — G4700 Insomnia, unspecified: Secondary | ICD-10-CM | POA: Insufficient documentation

## 2024-01-31 DIAGNOSIS — Z83719 Family history of colon polyps, unspecified: Secondary | ICD-10-CM | POA: Insufficient documentation

## 2024-01-31 DIAGNOSIS — J309 Allergic rhinitis, unspecified: Secondary | ICD-10-CM | POA: Insufficient documentation

## 2024-01-31 DIAGNOSIS — E7800 Pure hypercholesterolemia, unspecified: Secondary | ICD-10-CM | POA: Insufficient documentation

## 2024-01-31 DIAGNOSIS — R413 Other amnesia: Secondary | ICD-10-CM | POA: Diagnosis not present

## 2024-01-31 DIAGNOSIS — F419 Anxiety disorder, unspecified: Secondary | ICD-10-CM | POA: Insufficient documentation

## 2024-01-31 NOTE — Progress Notes (Signed)
 [facility]  PATIENT: Kristina Delgado DOB: 11-Oct-1955  REQUESTING CLINICIAN: Katina Pfeiffer, PA-C HISTORY FROM: Patient  REASON FOR VISIT: Memory loss    HISTORICAL  CHIEF COMPLAINT:  Chief Complaint  Patient presents with   RM 13    Memory Loss; alone    INTERVAL HISTORY 01/31/2024 Patient presents today for follow-up, last visit was a year ago at that time we obtained her dementia workup including ATN and APOE4 profile which was all negative.  Her MRI come back did not show any acute abnormality.  She completed her full neuropsychological testing her exam was normal.  Diagnosed with depression, she has recently started sertraline about 6 weeks ago and report improvement of her depressive symptoms but has not seen psychiatry or psychology yet.   HISTORY OF PRESENT ILLNESS:  This is 68 year old woman past medical history of atrial fibrillation, hypertension, hyperlipidemia, depression who is presenting with complaint of memory loss for the past 2 years.  Patient reports in the last couple years, she has noted that she is more forgetful than before and mainly has word finding difficulty.  A year and a half ago when she moved back to the [place] , she was living in [place] for 16 years, she noted that she was very depressed in the first 42-months, she did not go out of the house, and from then she noted also that her word finding difficulty got worse and her trouble remembering.  She also mentioned that she constantly check and recheck things, and will stiff forget items at the house. Her family tells her that she is repetitive, says the same thing over and over.  She also reports that she feels cold most of the time except at night when she wakes up in sweats.  Sleep has not been good in the past year, reported broken sleep but she can sleep between 7 to 9 hours.  She also reports of losing sensation in her rectum area, she does have episodes of stool incontinence.  She  has been seen by GI. When driving, she went past her destination at time (grocery store), reports some difficulty with recipes, has to check and recheck.    TBI: History of concussion  Stroke:   no past history of stroke Seizures:   no past history of seizures Sleep:   no history of sleep apnea.  Mood: Not formally diagnosed but felt depressed when she moved back from [place] 1.5 year ago  Family history of Dementia: Father with memory loss in the last 5 years in his life, he lived in his mid 37s.    Functional status: independent in all ADLs and IADLs Patient lives with husband. Cooking: yes, but has to check the recipe all the time  Cleaning: no issues  Shopping: no issues  Bathing: no issues  Toileting: no issues, sometimes has bowel incontinence  Driving: no recent accident Bills: Forgot to pay bills once  Medications: no issues  Ever left the stove on by accident?: Denies  Forget how to use items around the house?: Denies  Getting lost going to familiar places?: Denies  Forgetting loved ones names?: Denies  Word finding difficulty? Yes Sleep: 9 hrs, but would wake 2 to 3 times per night    OTHER MEDICAL CONDITIONS: Atrial fibrillation, Hypertension, Hyperlipidemia, Depression   REVIEW OF SYSTEMS: Full 14 system review of systems performed and negative with exception of: As noted in the HPI  ALLERGIES: Allergies  Allergen Reactions   Latex  Excoriated skin  -   Tegaderm.   Sulfa Antibiotics Hives   Other     tegaderm dermatitis   Shrimp [Shellfish Allergy] Other (See Comments)    HOME MEDICATIONS: Outpatient Medications Prior to Visit  Medication Sig Dispense Refill   ALPRAZolam (XANAX) 0.25 MG tablet Take 0.25 mg by mouth at bedtime as needed for anxiety. FOR FLYING     BEPREVE 1.5 % SOLN 2 (two) times daily as needed.      DYMISTA 137-50 MCG/ACT SUSP 2 (two) times daily as needed.      flecainide  (TAMBOCOR ) 100 MG tablet TAKE 2 TABS IF NEEDED FOR AFIB THEN 1  TABLET 12 HRS LATER; TAKE EVERY 12 HRS UP TO 6 DOSES IF NEEDED,THEN TAKE 1/2 TABLET EVERY 12 HRS FOR 2 DOSES 180 tablet 2   glycopyrrolate (ROBINUL) 2 MG tablet Take 1 mg by mouth daily.      hydrochlorothiazide  (MICROZIDE ) 12.5 MG capsule Take 1 capsule (12.5 mg total) by mouth daily. May take an additional 12.5 mg if needed 110 capsule 3   hyoscyamine (LEVSIN, ANASPAZ) 0.125 MG tablet Take 0.25 mg by mouth as needed. Takes 0.250 MG PRN     metoprolol  succinate (TOPROL  XL) 25 MG 24 hr tablet Take 1 tablet (25 mg total) by mouth daily. 90 tablet 3   metoprolol  tartrate (LOPRESSOR ) 25 MG tablet Take 1 tablet (25mg  ) as needed for breakthrough atrial fibrillation. 90 tablet 3   montelukast (SINGULAIR) 10 MG tablet daily.      pantoprazole (PROTONIX) 40 MG tablet 1 tablet 1/2 to 1 hour before morning meal Orally Once a day     rivaroxaban  (XARELTO ) 20 MG TABS tablet TAKE 1 TABLET(20 MG) BY MOUTH DAILY WITH SUPPER 90 tablet 1   rosuvastatin  (CRESTOR ) 10 MG tablet TAKE 1 TABLET(10 MG) BY MOUTH DAILY 90 tablet 3   sertraline (ZOLOFT) 50 MG tablet 1 tablet Orally Once a day; Duration: 60 days     No facility-administered medications prior to visit.    PAST MEDICAL HISTORY: Past Medical History:  Diagnosis Date   Abnormal Pap smear of cervix    yrs ago   Atrial fibrillation (HCC)    GERD (gastroesophageal reflux disease)    Followed by Dr. Celestia. Plan to repeat EGD: In 2021   Heart palpitations    Hyperlipidemia    Hypertension    previous hx   IBS (irritable bowel syndrome)    Malignant melanoma (HCC) 2006   History   Migraine    PAC (premature atrial contraction)    Rectal leakage    Simple endometrial hyperplasia without atypia 08/18/2013   Skin cancer 09/30/2004   malignant melanoma.    PAST SURGICAL HISTORY: Past Surgical History:  Procedure Laterality Date   CARDIOPULMONARY EXERCISE TEST  09/02/2017   No suggestion of any pulmonary or circulatory limitation to exercise with  normal functional capacity but there is concern for EKG changes there were still nondiagnostic.  Recommendation was to check a coronary CT angiogram   CERVICAL CERCLAGE     CHOLECYSTECTOMY  1980   CORONARY CT ANGIOGRAM  09/2017    Aortic atherosclerosis noted.  Calcium  score 90.  Mild nonobstructive disease in the LAD less than 50%.  Minimal disease in the RCA.  Recommend aggressive risk factor modification.   ENDOMETRIAL BIOPSY  2010   negative atypia/hyperplasia   Holter Monitor  06/2016   24 - [facility], [place]) close to 119,000 beats. Minimum heart rate 66, average 84,  maximum 143 of sinus rhythm. No PVCs noted no VT noted. Just over 16,000 supraventricular contractions/PACs noted. 3 episodes of greater than 10 beats paroxysmal atrial tachycardia (PAT - with max rate of 170 bpm). --> Higher incidence of PACs noted in 19 hours.   MELANOMA EXCISION  2006   OTHER SURGICAL HISTORY     full buttock revision   SHOULDER ARTHROSCOPY     left 2011/right 2012   TRANSTHORACIC ECHOCARDIOGRAM  06/23/2016   [facility] ([place]): Normal LV size and function. Hyperdynamic LV function (has EF of 82% recorded) mild aortic regurgitation and mild mitral regurgitation. Mild/borderline grade 1 diastolic dysfunction. Otherwise normal wall thickness.   WRIST SURGERY     rt wrist    FAMILY HISTORY: Family History  Problem Relation Age of Onset   Breast cancer Mother    Cancer Mother        bladder cancer,  precancerous moles.   Hypertension Mother    Coronary artery disease Mother 56       PCI 3 - angina   Hyperlipidemia Mother    Cancer Father        bladder   Hypertension Father    Bipolar disorder Father    Dementia Father    Cancer Sister        has atypical moles.   Diabetes Sister    Scleroderma Sister    Hypercholesterolemia Sister    Alcohol abuse Brother    Leukemia Maternal Grandmother 33    SOCIAL HISTORY: Social History   Socioeconomic History   Marital  status: Married    Spouse name: Not on file   Number of children: 2   Years of education: Not on file   Highest education level: Not on file  Occupational History   Not on file  Tobacco Use   Smoking status: Never   Smokeless tobacco: Never  Substance and Sexual Activity   Alcohol use: Yes    Alcohol/week: 8.0 - 14.0 standard drinks of alcohol    Types: 8 - 14 Glasses of wine per week   Drug use: No   Sexual activity: Yes    Partners: Male    Birth control/protection: Post-menopausal, Other-see comments    Comment: husband vasectomy  Other Topics Concern   Not on file  Social History Narrative   She is a married mother of 2 children, no grandchildren. 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 She lives with her husband.  They have recently moved back from having worked in [employer] living there for 17 years..    She has a BS/BSN.   She enjoys walking at least 50 minutes to an hour to 3 days a week. When she doesn't do it is because either too hot, too cold or she just feels too lazy.   Social Drivers of Corporate Investment Banker Strain: Not on file  Food Insecurity: Not on file  Transportation Needs: Not on file  Physical Activity: Not on file  Stress: Not on file  Social Connections: Not on file  Intimate Partner Violence: Not on file    PHYSICAL EXAM  GENERAL EXAM/CONSTITUTIONAL: Vitals:  Vitals:   01/31/24 0912  BP: 128/72  Weight: 144 lb (65.3 kg)  Height: 5' 4 (1.626 m)   Body mass index is 24.72 kg/m. Wt Readings from Last 3 Encounters:  01/31/24 144 lb (65.3 kg)  01/27/24 145 lb (65.8 kg)  04/30/23 138 lb 6.4 oz (62.8 kg)   Patient is in  no distress; well developed, nourished and groomed; neck is supple  MUSCULOSKELETAL: Gait, strength, tone, movements noted in Neurologic exam below  NEUROLOGIC: MENTAL STATUS:      No data to display         awake, alert, oriented to person, place and time recent and remote memory intact normal  attention and concentration language fluent, comprehension intact, naming intact fund of knowledge appropriate     01/31/2024    9:25 AM 01/21/2023   10:46 AM  Montreal Cognitive Assessment   Visuospatial/ Executive (0/5) 4 5  Naming (0/3) 3 3  Attention: Read list of digits (0/2) 2 2  Attention: Read list of letters (0/1) 1 1  Attention: Serial 7 subtraction starting at 100 (0/3) 3 3  Language: Repeat phrase (0/2) 2 2  Language : Fluency (0/1) 1 1  Abstraction (0/2) 2 2  Delayed Recall (0/5) 5 3  Orientation (0/6) 6 6  Total 29 28   CRANIAL NERVE:  2nd, 3rd, 4th, 6th- visual fields full to confrontation, extraocular muscles intact, no nystagmus 5th - facial sensation symmetric 7th - facial strength symmetric 8th - hearing intact 9th - palate elevates symmetrically, uvula midline 11th - shoulder shrug symmetric 12th - tongue protrusion midline  MOTOR:  normal bulk and tone, full strength in the BUE, BLE  SENSORY:  normal and symmetric to light touch  COORDINATION:  finger-nose-finger, fine finger movements normal  GAIT/STATION:  normal   DIAGNOSTIC DATA (LABS, IMAGING, TESTING) - I reviewed patient records, labs, notes, testing and imaging myself where available.  Lab Results  Component Value Date   WBC 4.4 09/23/2023   HGB 14.0 09/23/2023   HCT 41.8 09/23/2023   MCV 91 09/23/2023   PLT 283 09/23/2023      Component Value Date/Time   NA 134 09/23/2023 1138   NA 135 (L) 11/18/2015 1340   K 4.4 09/23/2023 1138   K 3.3 (L) 11/18/2015 1340   CL 95 (L) 09/23/2023 1138   CO2 24 09/23/2023 1138   CO2 30 (H) 11/18/2015 1340   GLUCOSE 87 09/23/2023 1138   GLUCOSE 102 11/18/2015 1340   BUN 9 09/23/2023 1138   BUN 11.7 11/18/2015 1340   CREATININE 0.66 09/23/2023 1138   CREATININE 0.8 11/18/2015 1340   CALCIUM  9.9 09/23/2023 1138   CALCIUM  9.4 11/18/2015 1340   PROT 7.2 12/04/2020 0926   PROT 6.9 11/18/2015 1340   ALBUMIN 5.2 (H) 12/04/2020 0926    ALBUMIN 3.8 11/18/2015 1340   AST 27 12/04/2020 0926   AST 17 11/18/2015 1340   ALT 33 (H) 12/04/2020 0926   ALT 13 11/18/2015 1340   ALKPHOS 29 (L) 12/04/2020 0926   ALKPHOS 27 (L) 11/18/2015 1340   BILITOT 0.4 12/04/2020 0926   BILITOT <0.30 11/18/2015 1340   GFRNONAA 88 09/20/2017 1550   GFRAA 101 09/20/2017 1550   Lab Results  Component Value Date   CHOL 146 09/23/2023   HDL 57 09/23/2023   LDLCALC 75 09/23/2023   TRIG 70 09/23/2023   CHOLHDL 2.6 09/23/2023   No results found for: HGBA1C Lab Results  Component Value Date   VITAMINB12 441 01/21/2023   Lab Results  Component Value Date   TSH 1.550 01/21/2023   ATN Profile negative for AD biomarkers   MRI Brain 02/25/2023 T2/FLAIR hyperintense foci predominantly in the deep white matter, predominantly in the periatrial region.  These appear to be acute.  They do not enhance.  These are nonspecific but most consistent  with mild chronic microvascular ischemic changes. Brain volume was normal.   No acute findings.   SUMMARY & IMPRESSION: Ms. Kristina Delgado is a 68 year old, right-hand dominant, White woman seen on referral from Pastor Falling, MD for memory-related concerns, which began in February 2023 when the patient moved back to the [place] after living in [place] for 18 years. The patient's husband has noticed an improvement in her symptoms overall, and they are not causing frank impairment in any area (e.g., ADL/IADLs).   Test results revealed intact functioning across all cognitive domains assessed without clinical exception. Specifically, findings showed average or better performance in domains of learning/memory, attention, processing speed, language, visuospatial/constructional skills and executive functioning. Emotionally, she endorsed moderate levels of depression and anxiety.   Overall, Ms. Kristina Delgado presentation is consistent with subject cognitive complaints, given the perception of cognitive decline with a  normal neuropsychological exam. She is experiencing active symptoms of depression and anxiety, which are likely contributing to her day-to-day perception of diminished cognitive functioning. While normal neuropsychological performance does not necessarily rule out the possibility of some mild and/or transient cognitive problems, it does provide strong evidence against any very significant impairment. Nonetheless, recommendations are provided below that are generally beneficial for optimizing overall brain health and aid in the prevention of future cognitive decline.   REFERRING DIAGNOSES:  Memory loss Word-finding difficulty  FINAL DIAGNOSES (ICD-10 considerations):  Cognitive complaints with normal neuropsychological examination (i.e., subjective cognitive complaints) Adjustment disorder with mixed anxiety and depressed mood   ASSESSMENT AND PLAN  68 y.o. year old female with history of atrial fibrillation, hypertension, hyperlipidemia, depression who is presenting for follow-up for her memory loss.  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  She did have a formal neuropsychological testing, but her diagnosis was cognitive complaint with normal neuropsychological examination.  She was also diagnosed with depression.  She did follow-up with her PCP and recently started on sertraline.  Since being on sertraline, reportS improvement of her depressive mood.  Advised patient to continue with sertraline, I will also refer her to both psychiatry and psychology for additional treatment.  She will continue to follow with PCP and return as needed.   1. Memory loss   2. Word finding difficulty   3. Depression, unspecified depression type      Patient Instructions  Continue current medications including supplement Refer to psychiatry for management of depression Referral to psychology for therapy sessions Continue follow-up PCP Return as needed  Orders Placed This Encounter   Procedures   Ambulatory referral to Psychiatry   Ambulatory referral to Psychology    No orders of the defined types were placed in this encounter.   Return if symptoms worsen or fail to improve.  Pastor Falling, MD 01/31/2024, 10:11 AM  [facility] [address], [address] [place], [place] [address] [phone]

## 2024-01-31 NOTE — Telephone Encounter (Signed)
 Referral psychology fax to [facility]. Phone: [phone], Fax: [phone]

## 2024-01-31 NOTE — Patient Instructions (Addendum)
 Continue current medications including supplement Refer to psychiatry for management of depression Referral to psychology for therapy sessions Continue follow-up PCP Return as needed

## 2024-02-25 ENCOUNTER — Ambulatory Visit: Attending: Cardiology | Admitting: Cardiology

## 2024-02-25 ENCOUNTER — Encounter: Payer: Self-pay | Admitting: Cardiology

## 2024-02-25 VITALS — BP 138/78 | HR 64 | Ht 64.4 in | Wt 145.4 lb

## 2024-02-25 DIAGNOSIS — I10 Essential (primary) hypertension: Secondary | ICD-10-CM | POA: Diagnosis not present

## 2024-02-25 DIAGNOSIS — E782 Mixed hyperlipidemia: Secondary | ICD-10-CM

## 2024-02-25 DIAGNOSIS — I480 Paroxysmal atrial fibrillation: Secondary | ICD-10-CM

## 2024-02-25 DIAGNOSIS — D6869 Other thrombophilia: Secondary | ICD-10-CM

## 2024-02-25 NOTE — Assessment & Plan Note (Signed)
 Target LDL for her is less than 100 based on her previous evaluation.  She remains on rosuvastatin  10 mg daily. - Continue current regimen.

## 2024-02-25 NOTE — Assessment & Plan Note (Signed)
 Episodes managed with flecainide , considering standing dose if frequency increases. Ablation discussed as future option. Emphasized active lifestyle benefits. - Continue flecainide  for breakthrough episodes. - Consider standing dose of flecainide  if episodes increase. - Discuss ablation if episodes increase in frequency or severity. - Encouraged active lifestyle.  Continue standing regimen of Toprol -XL 25 mg daily; and Xarelto  20 mg daily Continue PRN flecainide . => Flecainide  100 mg tablets take 2 tablets at onset of A-fib and then additional 1 tablet 12 hours after.  May take up to 4 doses of the 100 mg every 12 hours, then would continue 50 mg twice daily for 4 doses following conversion Lopressor  25 mg twice daily with additional one half to full dose as needed for breakthrough episodes of A-fib.

## 2024-02-25 NOTE — Assessment & Plan Note (Signed)
 Blood pressure generally well-controlled with Toprol . - Continue Toprol  25 mg daily.

## 2024-02-25 NOTE — Assessment & Plan Note (Signed)
 CHA2DS2-VASc score is 3.  Has been on Xarelto  but no major issues.  Okay to hold Xarelto  2 to 3 days preop for surgeries or procedures

## 2024-02-25 NOTE — Patient Instructions (Signed)
Medication Instructions:   NO CHANGES *If you need a refill on your cardiac medications before your next appointment, please call your pharmacy*   Lab Work: NOT NEEDED   Testing/Procedures: NOT NEEDED   Follow-Up: At [facility], you and your health needs are our priority.  As part of our continuing mission to provide you with exceptional heart care, we have created designated Provider Care Teams.  These Care Teams include your primary Cardiologist (physician) and Advanced Practice Providers (APPs -  Physician Assistants and Nurse Practitioners) who all work together to provide you with the care you need, when you need it.     Your next appointment:   12 month(s)  The format for your next appointment:   In Person  Provider:   David Harding, MD    

## 2024-02-25 NOTE — Progress Notes (Signed)
 Cardiology Office Note:  .   Date:  02/25/2024  ID:  Kristina Delgado, DOB December 03, 1955, MRN [id number] PCP: Kristina Pfeiffer, PA-C  [facility] Providers Cardiologist:  Kristina Clay, MD     Chief Complaint  Patient presents with   Follow-up    Doing well.   Atrial Fibrillation    7 or 8 breakthrough spells of A-fib since last visit-mostly break within 1 or 2 doses of flecainide  but occasionally has to take a third.    Patient Profile: .     Kristina Delgado is a  68 y.o. female  with a PMH noted below who presents here for 10 month f/u at the request of Kristina Pfeiffer, PA-C.  PMH: PAF, HTN, HLD as well as mild AI and family history of CAD (with reassuring CTA and CPX) who presents here for 4 month f/u at the request of Kristina, Courtney, PA-C.  PAF: Toprol  25 mg daily with PRN Lopressor  25 mg for breakthrough spells; Xarelto  PRN flecainide  for breakthrough spells: 2 tabs at onset of A-fib followed by 1 tablet every 12 hours up to 4 doses until converted, then 50 mg twice daily for 4 doses following conversion. HTN on Toprol  25 mg and HCTZ 12.5 mg. HLD-most recent LDL of 83 =-> recent statin holiday     Kristina Delgado was last seen on April 30, 2023.  She had had a few episodes of A-fib since I last seen her but they broke with flecainide .  Otherwise no major issues.  Lots of stress with having to move but otherwise doing well.  Subjective  Discussed the use of AI scribe software for clinical note transcription with the patient, who gave verbal consent to proceed.  History of Present Illness History of Present Illness Kristina Delgado is a 68 year old female with atrial fibrillation who presents for follow-up regarding her arrhythmia management.  She has experienced approximately eight episodes of atrial fibrillation since her last visit in February. These episodes typically resolve after taking the second dose of flecainide , although on two  occasions, resolution occurred between the second and third doses. She does not continue flecainide  after the episodes resolve. The episodes last between six to eighteen hours, often starting upon waking.  She is currently taking 25 mg of Toprol  and 12.5 mg of HCTZ. She is also on Xarelto  for anticoagulation.  No chest pain, pressure, shortness of breath, swelling, or syncope. She reports her blood pressure was a little up this morning, but it is not usually high.  Her primary care physician monitors her cholesterol, and recent labs from September show a total cholesterol of 182 mg/dL, HDL of 71 mg/dL, LDL of 95 mg/dL, triglycerides of 83 mg/dL, J8r of 4.2%, creatinine of 0.66 mg/dL, potassium of 4.1 mmol/L, magnesium of 1.8 mg/dL, and normal platelets.  She has recently moved closer to a [employer] and plans to resume regular exercise. She has not been active recently due to the move but intends to utilize the nearby facilities.  She mentions experiencing diarrhea and plans to see a gastroenterologist next week.   Cardiovascular ROS: no chest pain or dyspnea on exertion positive for - irregular heartbeat, palpitations, rapid heart rate, and associated with her A-fib breakthrough spells, usually convert within 2-3 doses of flecainide -does not last longer than 18 hours negative for - edema, orthopnea, paroxysmal nocturnal dyspnea, shortness of breath, or lightheadedness, dizziness or wooziness, syncope or near syncope, TIA or amaurosis fugax, claudication  ROS:  Review of Systems - Negative except a little bit of social stress.    Objective  Cardiac Medications:  - PRN Flecainide  100 mg BID x 4 doses - PRN Metoprolol  25 mg for breakthrough Afib RVR - Metoprolol  Succinate (Toprol  XL) 25 mg daily - Xarelto  20 mg daily - Rosuvastatin  10 mg daily - HCTZ 12.5 mg PRN edema  Social History Tobacco: Never smoker Living Situation: Recently moved to a townhouse near [employer]. The patient is  planning to start exercising at a nearby [facility] after moving. She is over 46 and considering using the [employer]ne[employer] program. The patient has been experiencing stress related to moving, which she believes may be affecting her health. She is also planning to start Medicare in January.  Studies Reviewed: Kristina Delgado   EKG Interpretation Date/Time:  Friday February 25 2024 09:44:58 EST Ventricular Rate:  64 PR Interval:  170 QRS Duration:  74 QT Interval:  412 QTC Calculation: 425 R Axis:   -10  Text Interpretation: Normal sinus rhythm Normal ECG When compared with ECG of 30-Apr-2023 09:27, No significant change was found Confirmed by Kristina Delgado ([id number]) on 02/25/2024 9:55:07 AM    Results Results LABS Total cholesterol: 182 (11/2023) HDL: 71 (11/2023) LDL: 95 (11/2023) Triglycerides: 83 (11/2023) Hemoglobin A1c: 5.7 (11/2023) Creatinine: 0.66 (11/2023) Potassium: 4.1 (11/2023) Magnesium: 1.8 (11/2023)    Previous Pertinent Studies ECHO (01/29/2023): EF 60 to 65%.  No RWMA.  Mild LA dilation.  Moderate RA dilation.  Moderate MR.  Mild AoV calcification/sclerosis but no stenosis.  Mild AI. (Results reviewed with the patient via Results Review) Coronary CTA (09/28/2017): CAC 90.  Mild mid LAD stenosis (25-50%).  Minimal disease in proximal RCA. CPX (09/04/2017): Normal functional capacity.  No pulmonary limitation exercise.  Borderline upsloping ST segments that do not meet diagnostic criteria.  Risk Assessment/Calculations:    CHA2DS2-VASc Score =    This patients CHA2DS2-VASc Score and unadjusted Ischemic Stroke Rate (% per year) is equal to 3.2 % stroke rate/year from a score of 3  Above score calculated as 1 point each if present [CHF, HTN, DM, Vascular=MI/PAD/Aortic Plaque, Age if 65-74, or Female] Above score calculated as 2 points each if present [Age > 75, or Stroke/TIA/TE]            Physical Exam:   VS:  BP 138/78 (BP Location: Left Arm, Patient Position: Sitting, Cuff Size:  Normal)   Pulse 64   Ht 5' 4.4 (1.636 m)   Wt 145 lb 6.4 oz (66 kg)   LMP 08/08/2013   BMI 24.65 kg/m    Wt Readings from Last 3 Encounters:  02/25/24 145 lb 6.4 oz (66 kg)  01/31/24 144 lb (65.3 kg)  01/27/24 145 lb (65.8 kg)     GEN: Well nourished, well groomed in no acute distress; healthy-appearing NECK: No JVD; No carotid bruits CARDIAC: Normal S1, S2; RRR, no murmurs, rubs, gallops RESPIRATORY:  Clear to auscultation without rales, wheezing or rhonchi ; nonlabored, good air movement. ABDOMEN: Soft, non-tender, non-distended EXTREMITIES:  No edema; No deformity      ASSESSMENT AND PLAN: .    Problem List Items Addressed This Visit       Cardiology Problems   Essential hypertension (Chronic)   Blood pressure generally well-controlled with Toprol . - Continue Toprol  25 mg daily.      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  Has been on Xarelto  but no major issues.   Okay to hold Xarelto  2 to 3 days preop for surgeries or procedures      Hyperlipidemia, mixed (Chronic)   Target LDL for her is less than 100 based on her previous evaluation.  She remains on rosuvastatin  10 mg daily. - Continue current regimen.      Paroxysmal atrial fibrillation (HCC) - Primary (Chronic)   Episodes managed with flecainide , considering standing dose if frequency increases. Ablation discussed as future option. Emphasized active lifestyle benefits. - Continue flecainide  for breakthrough episodes. - Consider standing dose of flecainide  if episodes increase. - Discuss ablation if episodes increase in frequency or severity. - Encouraged active lifestyle.  Continue standing regimen of Toprol -XL 25 mg daily; and Xarelto  20 mg daily Continue PRN flecainide . => Flecainide  100 mg tablets take 2 tablets at onset of A-fib and then additional 1  tablet 12 hours after.  May take up to 4 doses of the 100 mg every 12 hours, then would continue 50 mg twice daily for 4 doses following conversion Lopressor  25 mg twice daily with additional one half to full dose as needed for breakthrough episodes of A-fib.           Relevant Orders   EKG 12-Lead (Completed)         Follow-Up: Return in about 1 year (around 02/24/2025) for 1 Yr Follow-up, Routine follow up with me, [employer].     Signed, Kristina MICAEL Clay, MD, MS Kristina Delgado, M.D., M.S. Interventional Cardiologist  [facility] Pager # [phone]

## 2024-03-03 ENCOUNTER — Other Ambulatory Visit: Payer: Self-pay | Admitting: Cardiology

## 2024-05-09 ENCOUNTER — Ambulatory Visit (HOSPITAL_COMMUNITY): Payer: Self-pay | Admitting: Family
